# Patient Record
Sex: Male | Born: 1965 | Race: White | Hispanic: No | Marital: Married | Smoking: Never smoker
Health system: Southern US, Community
[De-identification: ages and names within clinical notes are randomized; demographics above are authoritative.]

## PROBLEM LIST (undated history)

## (undated) DIAGNOSIS — I82621 Acute embolism and thrombosis of deep veins of right upper extremity: Secondary | ICD-10-CM

## (undated) DIAGNOSIS — Q2730 Arteriovenous malformation, site unspecified: Secondary | ICD-10-CM

## (undated) DIAGNOSIS — I4891 Unspecified atrial fibrillation: Secondary | ICD-10-CM

## (undated) DIAGNOSIS — I82409 Acute embolism and thrombosis of unspecified deep veins of unspecified lower extremity: Secondary | ICD-10-CM

## (undated) DIAGNOSIS — E785 Hyperlipidemia, unspecified: Secondary | ICD-10-CM

## (undated) DIAGNOSIS — I10 Essential (primary) hypertension: Secondary | ICD-10-CM

## (undated) DIAGNOSIS — I480 Paroxysmal atrial fibrillation: Secondary | ICD-10-CM

## (undated) DIAGNOSIS — M519 Unspecified thoracic, thoracolumbar and lumbosacral intervertebral disc disorder: Secondary | ICD-10-CM

## (undated) DIAGNOSIS — H518 Other specified disorders of binocular movement: Secondary | ICD-10-CM

## (undated) DIAGNOSIS — N4340 Spermatocele of epididymis, unspecified: Secondary | ICD-10-CM

## (undated) DIAGNOSIS — Z7901 Long term (current) use of anticoagulants: Secondary | ICD-10-CM

## (undated) HISTORY — PX: ENDOSCOPIC LUMBAR DISCECTOMY W/ LASER: SUR438

## (undated) HISTORY — PX: VASECTOMY: SHX75

## (undated) HISTORY — PX: OTHER SURGICAL HISTORY: SHX169

## (undated) HISTORY — DX: Acute embolism and thrombosis of deep veins of right upper extremity (CMS-HCC): I82.621

## (undated) MED ORDER — SODIUM CHLORIDE 0.9 % IV SOLN
INTRAVENOUS | Status: AC
Start: 2019-11-13 — End: ?

## (undated) MED ORDER — STERILE WATER FOR INJECTION IJ SOLN
3000.0000 mg | Freq: Once | INTRAMUSCULAR | Status: AC
Start: 2019-11-13 — End: 2019-11-13

## (undated) MED ORDER — SODIUM CHLORIDE FLUSH 0.9 % IV SOLN
70.0000 mL | Freq: Once | INTRAVENOUS | Status: AC | PRN
Start: 2019-06-05 — End: ?

## (undated) MED ORDER — DEXAMETHASONE SODIUM PHOSPHATE 4 MG/ML IJ SOLN (CUSTOM)
10.0000 mg | Freq: Once | INTRAMUSCULAR | Status: AC
Start: 2019-11-13 — End: 2019-11-13

---

## 2019-02-20 ENCOUNTER — Inpatient Hospital Stay: Admit: 2019-02-20 | Discharge: 2019-02-20 | Disposition: A | Discharge status: Home Health | Payer: Self-pay

## 2019-02-24 ENCOUNTER — Inpatient Hospital Stay: Admit: 2019-02-24 | Discharge: 2019-02-24 | Disposition: A | Discharge status: Home Health | Payer: Self-pay

## 2019-04-17 ENCOUNTER — Ambulatory Visit: Payer: BLUE CROSS/BLUE SHIELD | Attending: Vascular Surgery | Admitting: Vascular Surgery

## 2019-04-17 ENCOUNTER — Encounter: Payer: Self-pay | Admitting: Vascular Surgery

## 2019-04-17 ENCOUNTER — Other Ambulatory Visit
Admission: RE | Admit: 2019-04-17 | Discharge: 2019-04-17 | Disposition: A | Discharge status: Home / Self Care | Payer: BLUE CROSS/BLUE SHIELD | Source: Ambulatory Visit | Attending: Family | Admitting: Family

## 2019-04-17 VITALS — BP 127/74 | HR 64 | Temp 97.9°F | Resp 16 | Ht 74.0 in | Wt 266.4 lb

## 2019-04-17 DIAGNOSIS — I4891 Unspecified atrial fibrillation: Secondary | ICD-10-CM | POA: Insufficient documentation

## 2019-04-17 DIAGNOSIS — I82621 Acute embolism and thrombosis of deep veins of right upper extremity: Secondary | ICD-10-CM | POA: Insufficient documentation

## 2019-04-17 DIAGNOSIS — M7989 Other specified soft tissue disorders: Secondary | ICD-10-CM | POA: Insufficient documentation

## 2019-04-17 DIAGNOSIS — Z6834 Body mass index (BMI) 34.0-34.9, adult: Secondary | ICD-10-CM | POA: Insufficient documentation

## 2019-04-17 DIAGNOSIS — I872 Venous insufficiency (chronic) (peripheral): Secondary | ICD-10-CM | POA: Insufficient documentation

## 2019-04-17 LAB — HOMOCYSTEINE, TOTAL, BLOOD: Homocysteine: 10 mcmol/L (ref 5–15)

## 2019-04-17 MED ORDER — ELIQUIS 5 MG PO TABS
5.0000 mg | ORAL_TABLET | Freq: Two times a day (BID) | ORAL | Status: AC
Start: 2019-04-13 — End: ?

## 2019-04-17 MED ORDER — DICLOFENAC SODIUM 1 % EX GEL
CUTANEOUS | Status: AC
Start: 2019-01-19 — End: ?

## 2019-04-17 MED ORDER — METOPROLOL SUCCINATE 50 MG OR TB24
50.0000 mg | ORAL_TABLET | Freq: Every day | ORAL | Status: AC
Start: 2019-04-12 — End: ?

## 2019-04-17 NOTE — Patient Instructions (Signed)
-  Two week follow up with Dr. Andreas Ohm    -Please have your la work completed prior to this appointment.    -Please bring the CD of images from previous ultrasound and ct scan at your next appointment.    Thank You

## 2019-04-17 NOTE — Progress Notes (Signed)
[name], MD  Dept. of Surgery/ Vascular and Endovascular Surgery  [address] [place], [address]  [place] [address]  [email or web address]  Appointments: [phone] Fax: [phone]    Consultation                                    Date of Evaluation: 04/17/2019    Referring Physician: Marquis Buggy    Primary Care Physician: No Pcp, Per Patient    Consulting Attending Physician: [name], MD    Service Provided: Outpatient Vascular/Endovascular Surgery Consultation    Reason for Consultation:   Chief Complaint   Patient presents with   . Thoracic Outlet Syndrome   Right forearm swelling    Chief Complaint and History of Present Illness:    Jamie Bartlett is a 53 year old male with PMH of lumbar stenosis, recent diagnosis of A-fib 1 month ago presents for consult right ulnar DVT, r/o venous TOS. Patient is left handed.    Patient reports waking up form sleep with pain and swelling on right forearm about 9 months ago. He did not seek any medical attention until about 3 months ago. The US showed DVT in the right ulnar vein. He was started on Eliquis. He was referred to a vascular surgeon Dr. [name] who ordered CTA. CTA on 02/20/19 showed a mild compression of the right subclavian vein by the first rib as it enters the thorax. Subsequently, patient was referred to [place] for venous TOS evaluation.     Denies any history of right arm/shoulder injury or trauma. Denies any change in color. The swelling have improved since started on Eliquis. Endorses intermittent tingling of the right hand when in dependent position. Denies history of DVT. No family history of DVT. Reports that he used to have the right forearm swelling back 20 years ago when he lift weights. However, the swelling resolved soon after the arm elevation.     Past Medical History:  Past Medical History:   Diagnosis Date   . Acute deep vein thrombosis (DVT) of ulnar vein of right upper extremity (CMS-HCC)        Past Surgical  History:   No past surgical history on file.    Social History:  Social History     Tobacco Use   . Smoking status: Never Smoker   . Smokeless tobacco: Never Used   Substance Use Topics   . Alcohol use: Not on file        Family History:  No family history of malignancy or hypercoagulable states    Home Medications:  Current Outpatient Medications   Medication Sig   . diclofenac (VOLTAREN) 1 % gel 4 GM APPLY ON THE SKIN TWICE A DAY AS NEEDED DISPENSE 500GM   . ELIQUIS 5 MG    . metoprolol succinate (TOPROL XL) 50 MG XL tablet      No current facility-administered medications for this visit.        Allergies:   No Known Allergies     Review of Systems: A 12 point review of systems was completed, reviewed, and pertinent positives and negatives noted in the Patient Health Survey Form archived in [facility].     Physical Exam:  BP 127/74   Pulse 64   Temp 97.9 F (36.6 C) (Temporal)   Resp 16   Ht 6\' 2"  (1.88 m)   Wt 120.8 kg (266 lb 6.8  oz)   BMI 34.21 kg/m   General:  Well developed, well nourished,  male, no acute distress.  Alert, oriented x4.  HEENT: Normocephalic, atraumatic.  Pupils equal, round, reactive to light and accommodation; Sclerae anicteric. Conjunctiva pink. Oropharynx is moist with no lesions. Tongue midline. Dentition intact  Neck: Supple.  Nontender. Trachea in midline. No lymphadenopathy.   Lungs: Clear to ausculation. Equal breath sound with no rales, rhonchi or wheezes.    Heart: No precordial lifts, heaves or thrills.  Regular rate and rhythm. Normal S1, S2. No gallops, rubs or murmurs.  Extremities: right forearm 2+ edema. Palpable right radial pulse. Hand warm. Upper arm without edema. Left radial pulse.  Skin:  Warm, dry, with no lesions. Normal turgor.  Neurologic: Cranial II-XII intact.  No focal motor, sensory or cerebellar neurologic findings.  Deep tendon reflexis are 2+ and equal.    Functional Status:  independent    Diagnostics:  02/20/2019 CTA RUE ([facility]):  FINDINGS:  The aortic arch is within normal limits. The right subclavian artery exits the thorax without evidenc eof compression. The partially visualized carotid and left subclavian arteries are within normal limits.   There is narrowing of the right subclavian vein at the level of the first rib as it enteres the thorax. This may be resulting in mild obstruction. The right internal jugular vein does not demonstrate compression as it enters the thorax.  The visualized muscles of the right upper extremity are normal in bulk and attenuation.  The visualised osseous structures are within normal limits.   The soft tissue structures in the partially visualized abdomen and pelvis are within normal limits.    IMPRESSION:  There is mild compression of the right subclavian vein by the first rib as it enters the thorax. The right internal jugular vein is within normal limits.  The right common carotid and subclavian arteries do not demonstrate extrinsic compression.      01/20/2019 [place] RUE Venous Duplex:  Occlusive deep vein thrombosis int he right ulnar vein. Subcutaneous interstitial edema in the forearm.    Assessment and Plan:  Muneeb Veras is a 53 year old male with recent diagnosis of A-fib 1 month ago presents for consult right ulnar vein DVT, r/o venous TOS.     1. Patient to bring CD of [place] and CTA.     2. Thrombectomy/thrombolysis likely not an option since 9 months have passed since the original incident. Continue Elquis. Unsure if the ulnar DVT and CTA finding of subclavian vein compression is related.     3. Thrombophilia panel    4. RTC in 2 weeks.       I attest that I had personally interviewed and examined Finlay Godbee during this encounter and have reviewed all of the available diagnostic studies along with the resident physician.  IHilma Favors, MD, agree with the recorded history, physical examination and evaluation above and have supervised formulation of the stated impression, recommendations and care  plan.    Patient with acute onset of left forearm swelling and findings of right ulnar vein DVT. It has been about 9 months since diagnosis and patient has been on Eliquis with improvement overall. The patient had CTA of the chest showing right subclavian vein compression.  I do not think these are related at this time due to his physical exam findings. Will have patient bring in his outside studies. He stated that he had a recent right arm ultrasound to evaluate for DVT.  Will order a hypercoagulable workup. Continue anticoagulation at this time.    All of patient's questions answered to the best of my ability.    [name], MD  04/17/2019 7PM

## 2019-04-18 ENCOUNTER — Encounter: Payer: Self-pay | Admitting: Vascular Surgery

## 2019-04-18 LAB — PROTHROMBIN G20210A MUTATION, BLOOD: Factor II Variant Dx:: NORMAL

## 2019-04-19 LAB — APC RESISTANT FV(LEIDEN): APC Resistance Panel: 3.7 (ref >=2.00)

## 2019-04-19 LAB — PROTEIN S FUNCTIONAL: Protein S Function Result: 138 % (ref 66–143)

## 2019-04-21 ENCOUNTER — Telehealth: Payer: Self-pay | Admitting: Vascular Surgery

## 2019-04-21 LAB — PROTEIN C ACTIVITY ASSAY, BLOOD: Protein C Activity: 116 % (ref 70–130)

## 2019-04-21 LAB — ANTI-THROMBIN III, BLOOD: Antithrombin III Level: 93 % (ref 80–120)

## 2019-04-21 NOTE — Telephone Encounter (Signed)
Patient calling to speak to nurse regarding CAT scan and [place]. Per patient he did not have scan done and does not know where he needs to take it. He also stated the [place] needs to be sent to Dr. Denton Brick ph: [phone]. Patient would like a call back to know what the status of orders. Please contact. Thank you

## 2019-04-21 NOTE — Telephone Encounter (Signed)
Spoke with patient who stated that he tried to get the ultrasound images for [place] however Dr. Ezequiel Kayser office stated that our office needs to request it. He is aware the CT scan images that we need were done at [facility]. Informed him to go to that hospital and ask for the images to be burned onto a CD to bring to upcoming apt - he agreed.     Spoke with [place] at Dr. Ezequiel Kayser office who stated they do not have an US done in September in their records however they do have a "vascular doppler" available done 12/7. Requested for images to be burned onto a CD and mailed to [place] - provided specific address. She stated the person who is able to burn the CD has left already however will follow up with him on Monday and hopefully mail it out by then.    The US of the RUE noted in referral done 01/20/19 may have been done with Dr Antionette Char (cardio) [phone]. Called Dr. Burt Knack office and it is closed - office open 830-4pm M-F. Will try calling back on Monday to obtain images    Harrison Mons. RN

## 2019-04-24 NOTE — Telephone Encounter (Signed)
Called Dr Lewanda Rife office and exchange was unable to get a hold of their office twice. They sent a message via fax asking to please call our office back so we can possibly obtain CD with images from 01/2019 ultrasound done.     Awaiting call back at this time    Harrison Mons. RN

## 2019-04-25 NOTE — Telephone Encounter (Addendum)
Spoke with patient who stated that he cannot go to [facility] to get the CD with images and requests for [place] to have it mailed to [place]. Informed him I can reach out however will have to move apt a week out further to give them enough time to send the CD. Confirmed new apt for the following week. Pt stated that he did not get anything done with Dr Maurine Cane - he said he might have gotten the RUE US done at [facility].     Spoke with Lorriane Shire from Radiology at [facility] [phone] who requested a fax of the request for the CTA RUE done 02/2019 and it can be faxed to [phone] or [phone] so images can be mailed out. Faxed over request with fax confirmation received.     Spoke with Loma Sousa from Medical records at [facility] [phone]. She stated to fax over the request to have CD mailed to [place] for patient's RUE US done in September 2020 to [phone] and for follow up we can call corporate office at [phone]. Faxed over request with fax confirmation received    Will await for CDs to arrive prior to upcoming visit with Dr. Daron Offer T. RN

## 2019-04-26 NOTE — Telephone Encounter (Signed)
Received report for RUE [place] from [place] with MR from [facility] and they stated to call [phone] corporate office to follow up on if the CD has been mailed out. Spoke with Waynette Buttery who stated that she is pushing the request through to processing and they should be mailing it out soon.     Harrison Mons RN

## 2019-05-01 ENCOUNTER — Other Ambulatory Visit: Payer: Self-pay | Admitting: Vascular Surgery

## 2019-05-01 ENCOUNTER — Ambulatory Visit: Payer: BLUE CROSS/BLUE SHIELD | Admitting: Vascular Surgery

## 2019-05-01 NOTE — Telephone Encounter (Signed)
Received CD of images from [facility] DVT study - uploaded to [employer] and sent to PACS.    Awaiting CTA of RUE from [facility] at this time.     Harrison Mons RN

## 2019-05-03 NOTE — Telephone Encounter (Signed)
Spoke with Lorriane Shire from [facility] Radiology and she stated she received the request for the CD to be mailed out to [place] - she confirmed it was mailed out on 12/22 and may take 5-7 business days to reach [place]. She confirmed the address it was mailed to as well and stated if we have not received it to call again and they would mail out another copy.    Harrison Mons RN

## 2019-05-08 ENCOUNTER — Other Ambulatory Visit: Payer: Self-pay | Admitting: Vascular Surgery

## 2019-05-08 ENCOUNTER — Ambulatory Visit: Payer: BLUE CROSS/BLUE SHIELD | Attending: Vascular Surgery | Admitting: Vascular Surgery

## 2019-05-08 ENCOUNTER — Encounter: Payer: Self-pay | Admitting: Vascular Surgery

## 2019-05-08 VITALS — BP 120/75 | HR 67 | Temp 97.9°F | Resp 14 | Ht 74.0 in | Wt 270.5 lb

## 2019-05-08 DIAGNOSIS — M7989 Other specified soft tissue disorders: Secondary | ICD-10-CM

## 2019-05-08 DIAGNOSIS — I82621 Acute embolism and thrombosis of deep veins of right upper extremity: Secondary | ICD-10-CM | POA: Insufficient documentation

## 2019-05-08 DIAGNOSIS — Z6834 Body mass index (BMI) 34.0-34.9, adult: Secondary | ICD-10-CM | POA: Insufficient documentation

## 2019-05-08 NOTE — Patient Instructions (Signed)
Please ensure that your diagnostic study is completed prior to your next visit with the doctor. If it will not be done prior to your next appointment please call [phone] to reschedule the appointment.    If you will be getting your study done here at [place] please call Radiology Scheduling Department to schedule your diagnostic study at [phone]    If your study is done outside of [place], you must bring a CD-rom of the images and the report with you to your next appointment.      Your results will be discussed at that visit with the doctor. You will not receive a call to discuss any results unless there is a critical finding. It is important to keep your scheduled appointments.     To make, change, or cancel an appointment please call [phone]

## 2019-05-08 NOTE — Progress Notes (Signed)
Jamie Keys, MD  Dept. of Surgery/ Vascular and Endovascular Surgery  [address] [place], [address]  [place] [address]  [email or web address]  Appointments: [phone] Fax: [phone]                                      Date of Evaluation: 05/08/2019    Primary Care Physician: No Pcp, Per Patient    Attending Physician: Jamie Keys, MD    Service Provided: Outpatient Vascular/Endovascular Surgery Follow up    Reason for Visit:   Chief Complaint   Patient presents with   . Thoracic Outlet Syndrome   Right forearm swelling    History of Present Illness:    Jamie Bartlett is a 54 year old male with a h/o of lumbar stenosis and A-fib diagnosed 03/2019.    Last visit 04/17/2019, pt reported waking up form sleep with pain and swelling on right forearm around 07/2018. He did not seek any medical attention until around 01/2019. The US showed DVT in the right ulnar vein. He was started on Eliquis. He was referred to a vascular surgeon Dr. [name] who ordered CTA. CTA on 02/20/19 showed a mild compression of the right subclavian vein by the first rib as it enters the thorax. Subsequently, patient was referred to [facility] for venous TOS evaluation.   Pt to bring CD of [place] and CTA. Thrombectomy/thrombolysis likely not an option due to delayed presentation. Continued Eliquis. Unsure if the ulnar DVT and CTA finding of subclavian vein compression are related. Ordered thrombophilia panel, results within normal limits.    Today, pt presents for 2 week followup venous thoracic outlet syndrome. He states his symptoms are improving with eliquis. Denies any history of trauma or repetitive overhand motions. Thrombophilia panel within normal limits.     04/17/2019 Thrombophilia panel  Normal    Past Medical History:  Past Medical History:   Diagnosis Date   . Acute deep vein thrombosis (DVT) of ulnar vein of right upper extremity (CMS-HCC)        Past Surgical History:   No past surgical history on file.    Home  Medications:  Current Outpatient Medications   Medication Sig   . diclofenac (VOLTAREN) 1 % gel 4 GM APPLY ON THE SKIN TWICE A DAY AS NEEDED DISPENSE 500GM   . ELIQUIS 5 MG    . metoprolol succinate (TOPROL XL) 50 MG XL tablet      No current facility-administered medications for this visit.        Allergies:   No Known Allergies     Review of Systems: A 12 point review of systems was completed, reviewed, and pertinent positives and negatives noted in the Patient Health Survey Form archived in [place] [facility].     Physical Exam:  BP 120/75 (BP Location: Left arm, BP Patient Position: Sitting, BP cuff size: Large)   Pulse 67   Temp 97.9 F (36.6 C) (Temporal)   Resp 14   Ht 6\' 2"  (1.88 m)   Wt 122.7 kg (270 lb 8.1 oz)   BMI 34.73 kg/m   General:  No acute distress.  Alert, oriented to person, place, time and situation.  HEENT: Normocephalic and atraumatic.  Pupils equal, round, reactive to light and accommodation.  Extraocular muscles intact.  Moist mucous membranes. Pharynx clear.  NECK: Supple without mass.  No evidence of lymphadenopathy.  No carotid bruit, right and left  Cardiovascular: Regular rate and rhythm. No murmur noted  Pulmonary: Clear to auscultation bilaterally. Symmetric expansion. Unlabored breathing.  Abdomen: Soft, nontender, nondistended.  NEURO: No gross abnormalities noted.  Extremities: RLE with swelling of distal forearm, no swelling of proximal arm. 2+ radial pulse bilaterally, good cap refill bilaterally. Sensation intact, motor strength 5/5 bilaterally.     Functional Status:  independent    Diagnostics:  02/20/2019 CTA RUE ([facility]): - outside report  FINDINGS:  The aortic arch is within normal limits. The right subclavian artery exits the thorax without evidenc eof compression. The partially visualized carotid and left subclavian arteries are within normal limits.   There is narrowing of the right subclavian vein at the level of the first rib as it enteres the  thorax. This may be resulting in mild obstruction. The right internal jugular vein does not demonstrate compression as it enters the thorax.  The visualized muscles of the right upper extremity are normal in bulk and attenuation.  The visualised osseous structures are within normal limits.   The soft tissue structures in the partially visualized abdomen and pelvis are within normal limits.    IMPRESSION:  There is mild compression of the right subclavian vein by the first rib as it enters the thorax. The right internal jugular vein is within normal limits.  The right common carotid and subclavian arteries do not demonstrate extrinsic compression.No results to report.      01/20/2019 [place] RUE Venous Duplex: - outside report  Occlusive deep vein thrombosis int he right ulnar vein. Subcutaneous interstitial edema in the forearm.    Assessment and Plan:  Jamie Bartlett is a 54 year old male with a h/o of lumbar stenosis and A-fib diagnosed 03/2019 with ulnar vein DVT in RUE. CTA reviewed in clinic, no apparent stenosis of subclavian vein. Unlikely DVT in ulnar vein caused by vTSO    Outside images have been loaded to [facility] and will be pushed to PACS.    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  Ultrasound from outside shows limited images and there is some ulnar vein thrombus.    Will continue anticoagulation at this time.      1. Will order MR venogram of the right upper extremity.  I called to discuss this with the patient after he left the office since the study had not been fully reviewed while he was here.  2.  Continue eliquis for 2 more months  3. DVT US RUE in 2 months  4. RTC 2 months or sooner if issues arise       Scribe Attestation  The notes I am recording reflect only actions made by and judgments taken by this provider, Dr. Hilma Favors, for whom I am scribing today.  I have performed no independent clinical  work.    Joy Brantley Stage    ____________________________________________________________________    Provider Attestation for Scribed Note    As the attending provider, I agree with the scribed content.  Any changes or edits are noted in the text above.    Hilma Favors, MD(A)

## 2019-06-05 ENCOUNTER — Ambulatory Visit
Admit: 2019-06-05 | Discharge: 2019-06-05 | Disposition: A | Discharge status: Home / Self Care | Payer: BLUE CROSS/BLUE SHIELD | Attending: Vascular Surgery | Admitting: Vascular Surgery

## 2019-06-05 ENCOUNTER — Inpatient Hospital Stay
Admit: 2019-06-05 | Discharge: 2019-06-05 | Disposition: A | Discharge status: Home Health | Payer: BLUE CROSS/BLUE SHIELD | Attending: Vascular Surgery | Admitting: Vascular Surgery

## 2019-06-05 DIAGNOSIS — I82621 Acute embolism and thrombosis of deep veins of right upper extremity: Secondary | ICD-10-CM

## 2019-06-05 DIAGNOSIS — M7989 Other specified soft tissue disorders: Secondary | ICD-10-CM

## 2019-06-06 ENCOUNTER — Telehealth: Payer: Self-pay | Admitting: Vascular Surgery

## 2019-06-06 NOTE — Telephone Encounter (Signed)
Patient requesting a call back from nurse regarding MRA for his arm. He stated they were not able to scan arm because the size of the machine, he could not fit his arm. Please contact. Thank you

## 2019-06-07 NOTE — Telephone Encounter (Addendum)
Spoke with patient who stated that he went to [facility] imaging here at [facility] but was not able to get arm into MRI machine d/t size of his arm. He was then sent to [facility] and he was able to get it in the machine but was unable to do the study still d/t size of his arm. He states theres an Tour manager in [place] that he was referred to by his friend and was told it is the largest machine available. Address is [address] [place], [place] [address] [phone] Informed him it would most likely have to be re-submitted to his insurance for authorization to be done at that facility - informed him I would contact [facility] and they can re-submit. However it may take a couple of days and if authorized he should receive a call to schedule his apt. He verbalized understanding - will keep apt for March with Dr [name] and is aware that he will need to get the CD with images and paper report to the apt.     Spoke with Toniann Fail at [facility] who stated she will contact the coordinator to resubmit for auth.     Loyola Mast RN

## 2019-06-13 ENCOUNTER — Encounter: Payer: Self-pay | Admitting: Vascular Surgery

## 2019-06-13 NOTE — Telephone Encounter (Signed)
Called regarding status of patient's MRA.  Left voicemail for patient to call us back at [phone].    [name], MD(A)

## 2019-06-28 ENCOUNTER — Other Ambulatory Visit: Payer: Self-pay | Admitting: Student in an Organized Health Care Education/Training Program

## 2019-06-29 ENCOUNTER — Inpatient Hospital Stay: Admit: 2019-06-29 | Discharge: 2019-06-29 | Disposition: A | Discharge status: Home Health | Payer: Self-pay

## 2019-07-03 ENCOUNTER — Ambulatory Visit: Payer: BLUE CROSS/BLUE SHIELD | Admitting: Vascular Surgery

## 2019-07-03 ENCOUNTER — Ambulatory Visit: Payer: BLUE CROSS/BLUE SHIELD

## 2019-07-17 ENCOUNTER — Ambulatory Visit: Payer: BLUE CROSS/BLUE SHIELD | Admitting: Vascular Surgery

## 2019-07-24 ENCOUNTER — Encounter: Payer: Self-pay | Admitting: Vascular Surgery

## 2019-07-24 ENCOUNTER — Ambulatory Visit: Payer: BLUE CROSS/BLUE SHIELD | Attending: Vascular Surgery | Admitting: Vascular Surgery

## 2019-07-24 ENCOUNTER — Ambulatory Visit (HOSPITAL_BASED_OUTPATIENT_CLINIC_OR_DEPARTMENT_OTHER)
Admit: 2019-07-24 | Discharge: 2019-07-24 | Disposition: A | Discharge status: Home Health | Payer: BLUE CROSS/BLUE SHIELD | Attending: Vascular Surgery | Admitting: Vascular Surgery

## 2019-07-24 VITALS — BP 146/83 | HR 70 | Temp 97.5°F | Resp 16 | Ht 74.0 in | Wt 276.8 lb

## 2019-07-24 DIAGNOSIS — Z6835 Body mass index (BMI) 35.0-35.9, adult: Secondary | ICD-10-CM | POA: Insufficient documentation

## 2019-07-24 DIAGNOSIS — Q279 Congenital malformation of peripheral vascular system, unspecified: Secondary | ICD-10-CM | POA: Insufficient documentation

## 2019-07-24 DIAGNOSIS — M7989 Other specified soft tissue disorders: Secondary | ICD-10-CM | POA: Insufficient documentation

## 2019-07-24 DIAGNOSIS — I82621 Acute embolism and thrombosis of deep veins of right upper extremity: Secondary | ICD-10-CM

## 2019-07-24 NOTE — Patient Instructions (Signed)
-  internal on the phone with imaging facility to request CD of images to be mailed out to out office.    -Once images have been received Dr. [name] will follow up with pt regarding plan.    Thank You

## 2019-07-24 NOTE — Progress Notes (Signed)
Hilma Favors, MD  Dept. of Surgery/ Vascular and Endovascular Surgery  [place], [place]  [place] [address]  [email or web address]  Appointments: [phone] Fax: [phone]                                  Date of Evaluation: 07/24/2019    Primary Care Physician: No Pcp, Per Patient    Attending Physician: Hilma Favors, MD    Service Provided: Outpatient Vascular/Endovascular Surgery Follow up    Reason for Visit:   Chief Complaint   Patient presents with   . Deep Vein Thrombosis     RUE ulnar vein   . Follow up Results     Same day [place]       History of Present Illness:    Shailen Thielen is a 54 year old male with a h/o of lumbar stenosis and A-fib diagnosed 03/2019.    During 04/17/2019 visit, pt reported waking up form sleep with pain and swelling on right forearm around 07/2018. He did not seek any medical attention until around 01/2019. The US showed DVT in the right ulnar vein. He was started on Eliquis. He was referred to a vascular surgeon Dr. Antionette Char who ordered CTA. CTA on 02/20/19 showed a mild compression of the right subclavian vein by the first rib as it enters the thorax. Subsequently, patient was referred to [facility] for venous TOS evaluation.   Pt to bring CD of [place] and CTA. Thrombectomy/thrombolysis likely not an option due to delayed presentation. Continue Eliquis. Unsure if the ulnar DVT and CTA finding of subclavian vein compression are related. Ordered thrombophilia panel, results within normal limits.    Last visit 05/08/19, symptoms improved with Eliquis. Denied history of trauma or repetitive overhand motions. Reviewed Thrombophilia panel, within normal limits. 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 Outside [place] images reviewed, limited images and some ulnar vein thrombus. Continue anticoagulation. Ordered MR Venogram of RUE. Ordered DVT US RUE.    MRA was performed @ [facility] @  [place], pending results.  US DVT UE right (3/22) showed proximal ulnar veins with thrombus/ non comp; large dilated vein branches deep from thrombosed ulnar vein; collaterals seen but difficult to define.    Today, pt presents for 1 month further management of DVT of ulnar vein of RUE. He states his symptoms are improving with eliquis. RUE swelling stays the same.    Past Medical History:  Past Medical History:   Diagnosis Date   . Acute deep vein thrombosis (DVT) of ulnar vein of right upper extremity (CMS-HCC)        Past Surgical History:   No past surgical history on file.    Home Medications:  Current Outpatient Medications   Medication Sig   . diclofenac (VOLTAREN) 1 % gel 4 GM APPLY ON THE SKIN TWICE A DAY AS NEEDED DISPENSE 500GM   . ELIQUIS 5 MG    . metoprolol succinate (TOPROL XL) 50 MG XL tablet      No current facility-administered medications for this visit.        Allergies:   No Known Allergies     Review of Systems: A 12 point review of systems was completed, reviewed, and pertinent positives and negatives noted in the Patient Health Survey Form archived in [place] [facility].     Physical Exam:  BP 146/83  Pulse 70   Temp 97.5 F (36.4 C) (Temporal)   Resp 16   Ht 6\' 2"  (1.88 m)   Wt 125.5 kg (276 lb 12.6 oz)   BMI 35.54 kg/m   General:  No acute distress.  Alert, oriented to person, place, time and situation.  HEENT: Normocephalic and atraumatic.  Pupils equal, round, reactive to light and accommodation.  Extraocular muscles intact.  Moist mucous membranes. Pharynx clear.  NECK: Supple without mass.  No evidence of lymphadenopathy.  No carotid bruit, right and left  Cardiovascular: Regular rate and rhythm. No murmur noted  Pulmonary: Clear to auscultation bilaterally. Symmetric expansion. Unlabored breathing.  Abdomen: Soft, nontender, nondistended.  NEURO: No gross abnormalities noted.  Extremities: RLE with swelling of distal forearm, no swelling of proximal arm. 2+ radial pulse  bilaterally, good cap refill bilaterally. Sensation intact, motor strength 5/5 bilaterally.             Functional Status:  independent    Diagnostics:  07/24/19 [place] 07/26/19 DUPLEX VENOUS DVT UE RIGHT  See above in HPI.      07/24/19 MRA Upper Extremity Right Side   Pending results - outside study      02/20/2019 CTA RUE ([facility]): - outside report  FINDINGS:  The aortic arch is within normal limits. The right subclavian artery exits the thorax without evidenc eof compression. The partially visualized carotid and left subclavian arteries are within normal limits.   There is narrowing of the right subclavian vein at the level of the first rib as it enteres the thorax. This may be resulting in mild obstruction. The right internal jugular vein does not demonstrate compression as it enters the thorax.  The visualized muscles of the right upper extremity are normal in bulk and attenuation.  The visualised osseous structures are within normal limits.   The soft tissue structures in the partially visualized abdomen and pelvis are within normal limits.    IMPRESSION:  There is mild compression of the right subclavian vein by the first rib as it enters the thorax. The right internal jugular vein is within normal limits.  The right common carotid and subclavian arteries do not demonstrate extrinsic compression.No results to report.      01/20/2019 01/22/2019 RUE Venous Duplex: - outside report  Occlusive deep vein thrombosis int he right ulnar vein. Subcutaneous interstitial edema in the forearm.    Assessment and Plan:  Toure Edmonds is a 54 year old male with a h/o of lumbar stenosis and A-fib diagnosed 03/2019 with ulnar vein DVT in RUE. CTA and 04/2019 reviewed in clinic, no apparent stenosis of subclavian vein. Unlikely DVT in ulnar vein caused by vTSO.    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   Called [facility] in [place] - will ask for images  and report to be sent to our office. Given that the patient is clinically doing about the same and tolerating anticoagulation, will review imaging after it has been received.    1. Schedule Zoom meeting for MRA result discussion - will re-contact patient once studies have arrived  2. Continue anticoagulation at this time    [place], MD PGY-1  General Surgery   07/24/2019      Scribe Attestation  The notes I am recording reflect only actions made by and judgments taken by this provider, Dr. 07/26/2019, for whom I am scribing today.  I have performed no independent  clinical work.    Joy Mena Pauls    ____________________________________________________________________    Provider Attestation for Scribed Note    As the attending provider, I agree with the scribed content.  Any changes or edits are noted in the text above.    I spent 30 minutes on the day of the encounter reviewing the patient's diagnostic tests including time spent preparing for today's visit (e.g. reviewing medical records and pertinent tests); independently interpreting results; obtaining and/or reviewing the patient's history; performing an appropriate physical examination/evaluation; counseling and educating the patient and/or family/caregiver; ordering appropriate medications, tests and/or procedures; referring and/or communicating with other healthcare professionals; coordinating care; and documenting in the electronic medical record.      [name]

## 2019-07-25 ENCOUNTER — Telehealth: Payer: Self-pay | Admitting: Vascular Surgery

## 2019-07-25 NOTE — Telephone Encounter (Signed)
Request for MRI/MRA of left arm CD with images and report to be mailed to [place] was faxed to [facility] at [phone] with fax confirmation received. Per Medical records [phone], they are short staffed and will mail it out as soon as possible - may be in about a week or two.     Loyola Mast RN

## 2019-07-25 NOTE — Telephone Encounter (Signed)
Opened in error

## 2019-08-17 ENCOUNTER — Telehealth: Payer: Self-pay | Admitting: Vascular Surgery

## 2019-08-17 NOTE — Telephone Encounter (Signed)
LVM with medical records at:     [facility]  [facility]  [address]  [address]  [place], [place] [address]  Phone:  [phone]  Fax:  [phone]    Informed them to please call as I had requested a CD to be mailed to [place] and have no received it yet. MRI/MRA of LUE is needed and to please call back with an update    Loyola Mast. RN

## 2019-08-25 ENCOUNTER — Telehealth: Payer: Self-pay | Admitting: Vascular Surgery

## 2019-08-25 NOTE — Telephone Encounter (Signed)
Spoke with Casimer Bilis from [facility] regarding the CD mail request I faxed last week. She asked if I could refax the request again to [phone]. Faxed request as requested.     Loyola Mast RN

## 2019-09-14 ENCOUNTER — Telehealth: Payer: Self-pay | Admitting: Vascular Surgery

## 2019-09-14 NOTE — Telephone Encounter (Signed)
LVM with [facility] to follow up on request sent out 3/23 and 4/23 for MRA/MRI imaging studies done for patient's left arm. No CDs have been received at this time and was calling for an update. Left office number to call. Awaiting call back.    Loyola Mast RN

## 2019-09-21 ENCOUNTER — Telehealth: Payer: Self-pay | Admitting: Vascular Surgery

## 2019-09-21 NOTE — Telephone Encounter (Signed)
Spoke with Boyd Kerbs at [facility] in [place] who confirms they have MRI studies and US venous duplex studies for right arm on file. Confirmed fax number to send request to for CD images and report to be mailed to [place]. Faxed it to [phone] with fax confirmation received    Loyola Mast. RN

## 2019-10-03 ENCOUNTER — Telehealth: Payer: Self-pay | Admitting: Vascular Surgery

## 2019-10-03 NOTE — Telephone Encounter (Signed)
Called patient regarding his MR venogram (which we just received a hard copy of today).   Patient states his right arm is about the same in size, not painful and has no discoloration.    We will review in our conference and plan for further management after discussion.    Patient appreciated the call.  [name], MD(A)

## 2019-10-03 NOTE — Telephone Encounter (Signed)
Received CD with images for patient as requested and uploaded to [facility]. Images shared with Dr [name] and PACS. Dr [name] is aware and stated she will review images and speak with patient regarding plan.    Loyola Mast RN

## 2019-10-06 ENCOUNTER — Other Ambulatory Visit: Payer: Self-pay | Admitting: Vascular Surgery

## 2019-10-26 ENCOUNTER — Encounter: Payer: Self-pay | Admitting: Student in an Organized Health Care Education/Training Program

## 2019-10-26 NOTE — Progress Notes (Signed)
VASCULAR/ENDOVASCULAR SURGERY AND RADIOLOGY CONFERENCE        DATE: 10/26/19     DIAGNOSIS: RUE AV malformation    HPI: This is a 54 year old male presenting with a h/o of lumbar stenosis and A-fib diagnosed 03/2019 with ulnar vein DVT in RUE.      IMAGING:   No results found.    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 Perform an angiogram?  Do compression and have [facility] make a custom compression for his arm?  Or surveillance.    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   Will discuss with patient regarding the symptoms and possible intervention if the patient is having significant pain.

## 2019-10-27 ENCOUNTER — Telehealth: Payer: Self-pay | Admitting: Vascular Surgery

## 2019-10-27 NOTE — Telephone Encounter (Signed)
Patient called to request a call back from   Dr [name].  Doctor promised him a call back a couple weeks ago about his blood clot and he has not received the call.    Please assist     [phone]) [phone] until 4pm

## 2019-10-29 NOTE — Telephone Encounter (Signed)
I called the patient.  Will need to review imaging.  He states that he is having arm symptoms after starting to work out.  Wants to know about need to continue anticoagulation.  I will contact him back regarding plan of care.    [name], MD(A)

## 2019-10-30 ENCOUNTER — Telehealth: Payer: Self-pay | Admitting: Vascular Surgery

## 2019-10-30 NOTE — Telephone Encounter (Signed)
Patient called to provide cell # and work # to doctor in case she calls today or tomorrow regarding plan of care.

## 2019-10-31 ENCOUNTER — Encounter: Payer: Self-pay | Admitting: Vascular Surgery

## 2019-10-31 DIAGNOSIS — Q2730 Arteriovenous malformation, site unspecified: Secondary | ICD-10-CM

## 2019-11-13 ENCOUNTER — Ambulatory Visit: Payer: BLUE CROSS/BLUE SHIELD | Attending: Vascular Surgery | Admitting: Interventional Radiology

## 2019-11-13 DIAGNOSIS — Q2730 Arteriovenous malformation, site unspecified: Secondary | ICD-10-CM | POA: Insufficient documentation

## 2019-11-13 NOTE — Progress Notes (Signed)
Jamie Bartlett. Jamie Schicker, MD  Vascular and Interventional Radiology  [facility]  [address] [place], [place] [address]  Appointments: [phone] Fax: [phone]    Zoom Consultation                                    Date of Evaluation: November 13, 2019    Referring Physician: [name], MD (A)    Primary Care Physician: No Pcp, Per Patient    Consulting Attending Physician: Jamie Bartlett. 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     Patient has been having pain/swelling at right arm since 07/2018 but did not seek medical attention until 01/2019. Ultrasound dated 01/20/2019 of right upper extremity showed DVT in the right ulnar vein. Subsequently the patient was started on Eliquis. Patient was referred to outside vascular surgeon Dr. [name] who ordered CTA. 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 Subsequently, the patient was referred to Dr. [name] of [facility]. On visit with them on 05/08/19, symptoms were improved with Eliquis. He denied history of trauma or repetitive overhand motions. Thrombophilia panel ordered in December 2020 was reviewed, found to be within normal limits. MR Venogram of RUE and [place] (DVT) of RUE were ordered and images imported into [facility].    Today, the patient states that he has been bothered by his right arm for approximately a year and that he has likely had a clot for more than that period of time. He confirms that his swelling worsens while exercising / working out. Pain is localized more towards the ulnar side, but he also  does note pain in the middle of his forearm. Patient states that he attempted to do 2 arm workouts and had immediate swelling of his right arm. He states that he has more right wrist swelling today than he did on 07/24/2019 during his visit with Dr. [name]. Patient is on Eliquis still.    Patient lives farther away [facility] area), but [place] closer [place] in [place] However, he has indicated that this is significantly life limiting for him and he would like to proceed with the procedure as soon as possible.    Past Medical History:  Past Medical History:   Diagnosis Date    Acute deep vein thrombosis (DVT) of ulnar vein of right upper extremity (CMS-HCC)      Past Surgical History:   No past surgical history on file.    Social History:  Social History     Socioeconomic History    Marital status: Single     Spouse name: Not on file    Number of children: Not on file    Years of education: Not on file    Highest education level: Not on file   Occupational History    Not on file   Tobacco Use    Smoking status: Never Smoker    Smokeless tobacco: Never Used   Substance and  Sexual Activity    Alcohol use: Not on file    Drug use: Not on file    Sexual activity: Not on file   Other Topics Concern    Not on file   Social History Narrative    Not on file     Social Determinants of Health     Financial Resource Strain:     Difficulty of Paying Living Expenses:    Food Insecurity:     Worried About Running Out of Food in the Last Year:     Merchant navy officer of Food in the Last Year:    Transportation Needs:     Freight forwarder (Medical):     Lack of Transportation (Non-Medical):    Physical Activity:     Days of Exercise per Week:     Minutes of Exercise per Session:    Stress:     Feeling of Stress :    Social Connections:     Frequency of Communication with Friends and Family:     Frequency of Social Gatherings with Friends and Family:     Attends Religious Services:      Active Member of Clubs or Organizations:     Attends Engineer, structural:     Marital Status:    Intimate Partner Violence:     Fear of Current or Ex-Partner:     Emotionally Abused:     Physically Abused:     Sexually Abused:      Family History:  No family history on file.    Home Medications:  No outpatient medications have been marked as taking for the 11/13/19 encounter (Appointment) with Elliette Seabolt, Jamie Ha, MD.     Allergies:   No Known Allergies     Review of Systems: A 12 point review of systems was completed, reviewed, and pertinent positives and negatives noted in the Patient Health Survey Form archived in [place] [facility].     Physical Exam:  General: No apparent distress.   Eyes: Sclerae anicteric. Moist conjunctivae. Pupils round regular and equal.  Ears, Nose, Throat: Ears and nose within normal limits. Oropharynx clear.   Neck: Full range of motion.   Respiratory: Normal respiratory effort.  Skin: No evidence of jaundice.   Neuro: Grossly nonfocal.  Extremities: Swelling noted on the right arm today on Zoom video.    Functional Status: independent    Diagnostics:  No results found for: ALB, ALT, AST, BUN, [place], CL, CHOL, CO2, CREAT, GFR, GFRAA, GFRNON, GLU, HDL, HGB, A1C, LDL, MG, PHOS, PLT, PLCTEL, K, PSA, NA, SODIUM, TRIG, WBC    Imaging:  No results found.    Assessment and Plan:  [name] is a 54 year old male with history of lumbar stenosis and atrial fibrillation diagnosed 03/2019 who presents with right forearm arteriovenous malformation.     The patient's outside imaging was reviewed with the patient. MR from 06/29/2019 was reviewed in detail, including location of occluded right ulnar vein and collateral vessels comprising the arteriovenous malformation. It was discussed that this point the right ulnar vein likely could not be stented open and has likely scarred down permanently. Additionally, stock imaging about arteriovenous malformations including pathophysiology were reviewed. We  reviewed risks of percutaneous sclerosis including but not limited to bleeding, infection, pain, damage to other organs, including mitigating measures including acquiring coagulation panels, sterilization technique, pain management, and image guidance during the procedure. Compartment syndrome was also reviewed. The patient will be given steroids to control  swelling. Due to risks from compartment syndrome, the procedure may need to be staged into multiple parts, with interval in between usually lasting 2 weeks. Moderate sedation vs. general anesthesia were discussed with the patient including screening process. It was reviewed that the procedure is minimally invasive and would only involve small incisions. Recovery and return to work schedule were discussed. Patient has indicated that he has difficulty with transportation (patient is alone) and is worried about pain control - he was offered 23 hour observation. Patient is amenable to this plan of care. All questions answered to the patient's satisfaction.     1.  Will schedule for right arm arteriovenous access angiogram with possible embolization, possible sclerothrapy of right forearm AVM in the near future (and will order associated pre-procedure labs and tests), with 23 hour observation.    Due to COVID-19 pandemic and a federally declared state of public health emergency, this telemedicine visit was conducted Audio+Video.     Approximately 60 minutes were spent by the physician and nurse practitioner in reviewing imaging and test results, obtaining and reviewing history, performing a physical exam, counseling the patient and family, entering orders, and entering information into the patient's record.    Patient Verification & Telemedicine:    - I have verified that the patient's identification to be correct & valid: Yes  - The patient, and / or surrogate, has been made aware that he is to be evaluated today using a home telemedicine visit technique (audio,  video & digital data transmission): Yes  - The patient, and / or surrogate, has been made aware that he has the right to refuse this type of evaluation at any time during the assessment period, and has been made aware of any alternatives to this type of evaluation: Yes  - The patient, and / or surrogate, has been made aware that he may need further evaluations in the future: Yes  - Patient confirmed their presence in the state of [place] and has consented to proceeding with telemedicine today. Yes     Scribe Attestation  The notes I am recording reflect only actions made by and judgments taken by this provider, Dr. Jacqulyn Bath, for whom I am scribing today.  I have performed no independent clinical work.    Rhea Bleacher Chow    ____________________________________________________________________    Provider Attestation for Scribed Note    As the attending provider, I agree with the scribed content.  Any changes or edits are noted in the text above.    Jamie Bartlett [place]ia Dohner  Patient Questionnaire Responses on 11/13/2019  Will you be in [place] for the duration of your visit?: Yes  Do you consent to proceed with the Video Visit today?: Yes

## 2019-11-13 NOTE — Patient Instructions (Signed)
-   schedule for Right arm angiogram, possible embolization, possible sclerotherapy  - pre op labs with [facility]        - Blood test 1-2 weeks prior your procedure. If you are getting it done outside of [facility], please remind the lab to fax results to [phone].  - Nothing to eat or drink 8 hours prior your procedure  - Okay to take your routine medication in the morning prior your procedure with a sip of water  - If you take ACE inhibitors or Angiotensin Receptor blockers (ARB) for blood pressure, do not take them the morning of procedure, but bring the medication with you.  - Hold diabetes medication the day of the procedure  - Hold Aspirin/NSAIDs (Ibuprofen/Motrin/Advil/Naproxen/Aleve), Plavix and Fish oils for 5 days  - Hold Heparin/Lovenox 1 dose prior procedure  - Hold Eliquis/Xarelto for 2 days  - Hold Coumadin per instruction by your ordering provider and check INR 1 day prior the procedure  - No driving for 24 hours post procedure  - Make sure you have a driver to pick you up.         For scheduling:  Please call [phone], pick option 1, and option 5, to reach IR scheduling office.    For IR MD questions:  Please call [phone] or [phone] or [phone].  Fax # [phone] or [phone]    Ulanda Edison, NP  [facility]  [address] [place]  Interventional Radiology Department, [address]  Email: [email or web address]    Thank you.

## 2019-11-14 ENCOUNTER — Telehealth: Payer: Self-pay

## 2019-11-14 NOTE — Telephone Encounter (Signed)
IR: left voice message to the pt. To call back IR to know Dr. Mervyn Skeeters. Devini's Number: need to get permission to hold Eliquis 3 days prior to the procedure.

## 2019-11-17 ENCOUNTER — Telehealth: Payer: Self-pay

## 2019-11-17 NOTE — Telephone Encounter (Signed)
IR: called pt.'s cardiologist- Dr. Gaylord Shih  T: [phone], ( fax: [phone]) to get permission to hold Eliquis for 3 days prior to the procedure. Talked to the office and left message for [place]. Receptionist said she would call me and let me know. IR number provided [phone].

## 2019-11-23 ENCOUNTER — Telehealth: Payer: Self-pay

## 2019-11-23 NOTE — Telephone Encounter (Signed)
IR: Dr. Gaylord Shih said ok to hold Eliquis for 3 days prior to the procedure per Christine( Dr. Elaina Pattee offive)

## 2019-11-29 ENCOUNTER — Other Ambulatory Visit: Payer: Self-pay | Admitting: Interventional Radiology

## 2019-11-30 ENCOUNTER — Telehealth: Payer: Self-pay

## 2019-11-30 NOTE — Telephone Encounter (Signed)
Pre-op messages left via voicemail instructing patient to call back ASAP for pre-op instructions. Call back number provided ([phone]).

## 2019-11-30 NOTE — Telephone Encounter (Signed)
IR pre-procedure COVID-19 testing order

## 2019-12-04 ENCOUNTER — Telehealth: Payer: Self-pay

## 2019-12-04 ENCOUNTER — Encounter: Payer: Self-pay | Admitting: Interventional Radiology

## 2019-12-04 NOTE — Telephone Encounter (Signed)
Called [facility] to request labs done on 12/01/19. Per Medical records, request needs to be faxed to [phone].  STAT request faxed to above number.  Confirmation received.

## 2019-12-05 ENCOUNTER — Encounter: Admission: RE | Disposition: A | Discharge status: Home Health | Payer: Self-pay | Attending: Interventional Radiology

## 2019-12-05 ENCOUNTER — Ambulatory Visit
Admission: RE | Admit: 2019-12-05 | Discharge: 2019-12-05 | Disposition: A | Discharge status: Home / Self Care | Payer: BLUE CROSS/BLUE SHIELD | Attending: Interventional Radiology | Admitting: Interventional Radiology

## 2019-12-05 ENCOUNTER — Encounter: Payer: Self-pay | Admitting: Interventional Radiology

## 2019-12-05 ENCOUNTER — Ambulatory Visit: Payer: BLUE CROSS/BLUE SHIELD

## 2019-12-05 DIAGNOSIS — I4891 Unspecified atrial fibrillation: Secondary | ICD-10-CM | POA: Insufficient documentation

## 2019-12-05 DIAGNOSIS — Q2730 Arteriovenous malformation, site unspecified: Secondary | ICD-10-CM

## 2019-12-05 DIAGNOSIS — Q278 Other specified congenital malformations of peripheral vascular system: Secondary | ICD-10-CM | POA: Insufficient documentation

## 2019-12-05 DIAGNOSIS — Z86718 Personal history of other venous thrombosis and embolism: Secondary | ICD-10-CM | POA: Insufficient documentation

## 2019-12-05 DIAGNOSIS — Z79899 Other long term (current) drug therapy: Secondary | ICD-10-CM | POA: Insufficient documentation

## 2019-12-05 DIAGNOSIS — Z7901 Long term (current) use of anticoagulants: Secondary | ICD-10-CM | POA: Insufficient documentation

## 2019-12-05 HISTORY — DX: Arteriovenous malformation, site unspecified: Q27.30

## 2019-12-05 HISTORY — DX: Unspecified atrial fibrillation (CMS-HCC): I48.91

## 2019-12-05 HISTORY — DX: Acute embolism and thrombosis of deep veins of right upper extremity (CMS-HCC): I82.621

## 2019-12-05 LAB — ACT K (ISTAT) POINT OF CARE TESTING: ACT K (iSTAT) POC: 213 SEC. — ABNORMAL HIGH (ref 74–137)

## 2019-12-05 SURGERY — IR EMBO NON-CNS
Anesthesia: Moderate Sedation - by non-anesthesia staff only | Laterality: Right | Wound class: Class I (Clean)

## 2019-12-05 MED ORDER — SODIUM CHLORIDE 0.9 % IV SOLN
INTRAVENOUS | Status: DC
Start: 2019-12-05 — End: 2019-12-05

## 2019-12-05 MED ORDER — LIDOCAINE HCL 1 % IJ SOLN
INTRAMUSCULAR | Status: DC | PRN
Start: 2019-12-05 — End: 2019-12-05
  Administered 2019-12-05 (×2): 10 mL via INTRADERMAL

## 2019-12-05 MED ORDER — IOHEXOL 300 MG/ML IJ SOLN
INTRAMUSCULAR | Status: AC
Start: 2019-12-05 — End: ?
  Filled 2019-12-05: qty 50

## 2019-12-05 MED ORDER — DEXAMETHASONE SODIUM PHOSPHATE 20 MG/5ML IJ SOLN
INTRAMUSCULAR | Status: AC
Start: 2019-12-05 — End: ?
  Filled 2019-12-05: qty 5

## 2019-12-05 MED ORDER — FENTANYL CITRATE (PF) 100 MCG/2ML IJ SOLN
INTRAMUSCULAR | Status: AC
Start: 2019-12-05 — End: ?
  Filled 2019-12-05: qty 2

## 2019-12-05 MED ORDER — PROTAMINE SULFATE 10 MG/ML IV SOLN
INTRAVENOUS | Status: DC | PRN
Start: 2019-12-05 — End: 2019-12-05
  Administered 2019-12-05 (×2): 40 mg via INTRAVENOUS

## 2019-12-05 MED ORDER — DEXAMETHASONE SODIUM PHOSPHATE 4 MG/ML IJ SOLN (CUSTOM)
10.0000 mg | Freq: Once | INTRAMUSCULAR | Status: AC
Start: 2019-12-05 — End: 2019-12-05
  Administered 2019-12-05: 10 mg via INTRAVENOUS
  Filled 2019-12-05: qty 2.5

## 2019-12-05 MED ORDER — METHYLPREDNISOLONE 4 MG OR KIT
ORAL_TABLET | ORAL | 0 refills | Status: DC
Start: 2019-12-05 — End: 2019-12-06

## 2019-12-05 MED ORDER — PROTAMINE SULFATE 10 MG/ML IV SOLN
INTRAVENOUS | Status: AC
Start: 2019-12-05 — End: ?
  Filled 2019-12-05: qty 5

## 2019-12-05 MED ORDER — MIDAZOLAM HCL 2 MG/2ML IJ SOLN
INTRAMUSCULAR | Status: DC | PRN
Start: 2019-12-05 — End: 2019-12-05
  Administered 2019-12-05 (×2): 0.5 mg via INTRAVENOUS
  Administered 2019-12-05 (×3): 1 mg via INTRAVENOUS

## 2019-12-05 MED ORDER — HEPARIN (PORCINE) IN NACL 1000-0.9 UT/500ML-% IV SOLN
INTRAVENOUS | Status: AC
Start: 2019-12-05 — End: ?
  Filled 2019-12-05: qty 500

## 2019-12-05 MED ORDER — HEPARIN SODIUM (PORCINE) 1000 UNIT/ML IJ SOLN (CUSTOM)
INTRAMUSCULAR | Status: DC | PRN
Start: 2019-12-05 — End: 2019-12-05
  Administered 2019-12-05 (×2): 5000 [IU] via INTRAVENOUS

## 2019-12-05 MED ORDER — MIDAZOLAM HCL (PF) 2 MG/2ML IJ SOLN
INTRAMUSCULAR | Status: AC
Start: 2019-12-05 — End: ?
  Filled 2019-12-05: qty 2

## 2019-12-05 MED ORDER — LIDOCAINE HCL 1 % IJ SOLN
INTRAMUSCULAR | Status: AC
Start: 2019-12-05 — End: ?
  Filled 2019-12-05: qty 20

## 2019-12-05 MED ORDER — NITROGLYCERIN 50 MG/250 ML D5W PREMIX INFUSION (~~LOC~~)
INTRAVENOUS | Status: DC | PRN
Start: 2019-12-05 — End: 2019-12-05
  Administered 2019-12-05 (×2): 100 ug via INTRA_ARTERIAL

## 2019-12-05 MED ORDER — HEPARIN (PORCINE) IN NACL 1000-0.9 UT/500ML-% IV SOLN
INTRAVENOUS | Status: AC
Start: 2019-12-05 — End: ?
  Filled 2019-12-05: qty 1500

## 2019-12-05 MED ORDER — IOHEXOL 300 MG/ML IJ SOLN
INTRAMUSCULAR | Status: AC
Start: 2019-12-05 — End: ?
  Filled 2019-12-05: qty 150

## 2019-12-05 MED ORDER — IOHEXOL 300 MG/ML IJ SOLN
INTRAMUSCULAR | Status: DC | PRN
Start: 2019-12-05 — End: 2019-12-05
  Administered 2019-12-05 (×2): 80 mL

## 2019-12-05 MED ORDER — STERILE WATER FOR INJECTION IJ SOLN
3000.0000 mg | Freq: Once | INTRAMUSCULAR | Status: AC
Start: 2019-12-05 — End: 2019-12-05
  Administered 2019-12-05: 3000 mg via INTRAVENOUS
  Filled 2019-12-05: qty 3000

## 2019-12-05 MED ORDER — CEFAZOLIN SODIUM 1 GM IJ SOLR
INTRAMUSCULAR | Status: AC
Start: 2019-12-05 — End: ?
  Filled 2019-12-05: qty 3000

## 2019-12-05 MED ORDER — FENTANYL CITRATE (PF) 100 MCG/2ML IJ SOLN
INTRAMUSCULAR | Status: DC | PRN
Start: 2019-12-05 — End: 2019-12-05
  Administered 2019-12-05: 25 ug via INTRAVENOUS
  Administered 2019-12-05 (×2): 50 ug via INTRAVENOUS
  Administered 2019-12-05: 25 ug via INTRAVENOUS
  Administered 2019-12-05: 50 ug via INTRAVENOUS

## 2019-12-05 MED ORDER — HEPARIN SODIUM (PORCINE) 1000 UNIT/ML IJ SOLN (CUSTOM)
INTRAMUSCULAR | Status: AC
Start: 2019-12-05 — End: ?
  Filled 2019-12-05: qty 10

## 2019-12-05 MED ORDER — SODIUM TETRADECYL SULFATE 3 % IV SOLN
INTRAVENOUS | Status: AC
Start: 2019-12-05 — End: ?
  Filled 2019-12-05: qty 6

## 2019-12-05 SURGICAL SUPPLY — 23 items
Bowl Mixing 16oz Graduated Plastic Sterile (Misc Surgical Supply) ×2 IMPLANT
CATH ANGIOGRAPH MP ANG 5.0 FR X .038" X 40 CM HNBR5.0-38-40-P-NS-KMP (Procedural wires/sheaths/catheters/balloons/dilators) ×2 IMPLANT
COVER - ULTRASOUND PROBE (Drapes/towels) ×2 IMPLANT
DEVICE - 3 WAY STOPCOCK (PLASTIC) (Misc Medical Supply) ×4 IMPLANT
DRAPE - FLAT WITH ADHESIVE STRIP (Drapes/towels) ×6 IMPLANT
DRESSING - 2 X 2 PAD (Dressings/packing) ×4 IMPLANT
DRESSING - 4 X 4 XRAY DETECTABLE SPONGES (Dressings/packing) ×2 IMPLANT
DRESSING - SMALL TEGADERM 2 X 2 (Dressings/packing) ×4 IMPLANT
GLOVES BIOGEL PI 6.5 LF PF NON-LATEX (Gloves/Gowns) ×2 IMPLANT
GLOVES SUPERSEN 7.5 LTX PF (Gloves/Gowns) ×2 IMPLANT
GUIDEWIRE VASCULAR 0.018IN 180CM 90/45DEG DOUBLE ANGLE TIP GLIDEWIRE GT (Procedural wires/sheaths/catheters/balloons/dilators) ×2 IMPLANT
GUIDEWIRE VASCULAR STAINLESS STEEL PTFE L145 CM L20 CM OD.035 IN CEREBRAL BENTSON PLUS STRAIGHT FLEX (Procedural wires/sheaths/catheters/balloons/dilators) ×2 IMPLANT
Guidewire Vascular 0.016in 180cm Straight 25m Tip Fathom 16 Steerable (Procedural wires/sheaths/catheters/balloons/dilators) ×2 IMPLANT
KIT INTRO 10CM 5FR 22GA GLIDESHEATH SLNDR .021IN 25MM PLS HDRPH JKT NDL DIL SHORTANGLE MINIWIRE 45CM (Kits/Sets/Trays) ×2 IMPLANT
MICROCATHETER 2.4FR 130CM 750PSI HEMOSTATIC VALVE RO MARKER HYDROPHILIC PROGREAT (Procedural wires/sheaths/catheters/balloons/dilators) ×2 IMPLANT
PACK - BODY IR (Procedure Packs/kits) ×2 IMPLANT
SYRINGE - MEDALLION 1CC DARK GREEN (Misc Medical Supply) ×2 IMPLANT
SYRINGE - MEDALLION 3CC DARK GREEN (Misc Medical Supply) ×10 IMPLANT
SYRINGE - MEDALLION 3CC LIGHT BLUE (Misc Medical Supply) ×10 IMPLANT
SYRINGE - MEDALLION 3CC RED (Misc Medical Supply) ×4 IMPLANT
SYRINGE MEDALLION 10ML FIXED MALE LUER CONNECTOR WHITE STERILE (Misc Medical Supply) ×4 IMPLANT
TOWELS, STERILE BLUE (Drapes/towels) ×2 IMPLANT
WIRE - 035 X 150CM TERUMO ANGLED GLIDEWIRE STANDARD (Procedural wires/sheaths/catheters/balloons/dilators) ×2 IMPLANT

## 2019-12-05 NOTE — OR Nursing (Signed)
Nursing Sedation Completion    [facility] NURSING SEDATION COMPLETION:   Sedation Level Achieved:  Moderate  Complications:  None  Patient tolerance:  Tolerated well, no immediate complications

## 2019-12-05 NOTE — Brief Op Note (Signed)
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    PROCEDURE: Procedure(s):  Image guided Right arm angiogram, possible embolization, possible sclerotherapy with moderate sedation, possible 23 hour observation, Treatment #1 - Wound Class: Class I (Clean) - Incision Closure: No Incision / NA    Physician(s): Surgeon(s) and Role:     * Marcelus Dubberly, [name], MD - Primary     * Cramer, Geryl Councilman, MD - Resident - Assisting     * Willeen Cass, Amanda Cockayne, MD - Fellow    FINDINGS:  - Patent and superficial brachial artery, successfully accessed antegrade. Diagnostic angiogram demonstrates a small arterial feeder into the malformation off the midulnar artery. The vessel was too small to catheterize.  - Hemostasis @ arteriotomy achieved with manual compression after reversing with protamine.  - Direct puncture of the slow flow portion of the malformation was performed x2. Successful sclerosis with 59mL Sotradecol foam (3:1 air/STS via Tessari technique).     SPECIMENS: None    Fluids/Blood Products:   - EBL: 22mL    DISPOSITION:  - Stable. Likely discharge home in a sling after 2 hour recovery in PACU and reassessment of the RUE neuro exam.   - Monitor vitals  - Pain management  - Medrol dose pack upon discharge

## 2019-12-05 NOTE — H&P (Signed)
Jamie Bartlett is a 54 year old male,   Scheduled for Procedure(s):  Image guided Right arm angiogram, possible embolization, possible sclerotherapy with moderate sedation, possible 23 hour observation, Treatment #1 on   12/05/2019 - POD Day of Surgery    See IR clinic note for details.    PMHx:   Past Medical History:   Diagnosis Date   . A-fib (CMS-HCC)    . Acute deep vein thrombosis (DVT) of ulnar vein of right upper extremity (CMS-HCC)    . AVM (arteriovenous malformation)    . Deep vein thrombosis (DVT) of ulnar vein of right upper extremity, unspecified chronicity (CMS-HCC)      Surgical HX: No past surgical history on file.  Home meds:   Medications Prior to Admission   Medication Sig Dispense Refill Last Dose   . diclofenac (VOLTAREN) 1 % gel 4 GM APPLY ON THE SKIN TWICE A DAY AS NEEDED DISPENSE 500GM      . ELIQUIS 5 MG    12/02/2019   . metoprolol succinate (TOPROL XL) 50 MG XL tablet         Current Inpatient Medications:  . ceFAZolin (ANCEF) IV  3,000 mg Once   . dexAMETHasone  10 mg Once     . sodium chloride           ALLERGIES:  No Known Allergies    History and Physical    Pre-op Diagnosis: right forearm AVM  Review of systems  Constitutional:  Denies Fever  HEENT:  Denies tinnitus  Cardiovascular:  Denies chest pain  Respiratory:  Denies difficulty breathing  Gastrointestinal:  Denies indigestion  Neurological:  Denies headache  Allergic/Immunologic:  Denies hives  Dermatological:  Denies Skin Rash    Physical Exam:  General Appearance:  Well appearing and No acute distress  HEENT:  EOMI  Respiratory:  Good chest wall excursion and Breathing unlabored  Neurologic:  Awake, Alert and Oriented  GU:  Voids spontaneously  GI:  Abdomen non-distended  Extremities:  Other (see comments)  ASA Classification: class 2 - patient with mild systemic disease        Mallampati score:  II - soft palate, uvula, fauces visible    Sedation to be used?: Yes    Mouth Opening:  3 cm or greater  Dentition:  Normal  Jaw  protrusion:  Normal  Thyromental distance:  6 cm  Neck Anatomy:  Normal  C-spine mobility:  Full    Beard Present: No    History of difficult intubation:  No  Sedation Level Intended:  Moderate    Coags Reviewed: Yes      H&P Additional Comments:  Right forearm AVM       12/05/19  0834   BP: 132/79   Pulse: 65   Resp: 9   Temp: 97.5 F (36.4 C)   SpO2: 99%       LABS:  No visits with results within 3 Day(s) from this visit.   Latest known visit with results is:   Hospital Outpatient Visit on 04/17/2019   Component Date Value Ref Range Status   . Protein S Function Result 04/17/2019 138  66 - 143 % Final    Comment: (NOTE)  INTERPRETIVE INFORMATION: Protein S, Functional  Patients on warfarin may have decreased functional protein   S values. Patients should be off warfarin therapy for two   weeks for accurate measurement of functional protein S.   Artificially  increased functional protein S values may be  due to heparin therapy or the presence of direct thrombin   inhibitors or factor Xa inhibitors.    Access complete set of age- and/or gender-specific   reference intervals for this test in the [facility]   Test Directory ([email or web address]).  Performed by [facility],  [address], [place] [address] [phone]  [email or web address], Tracy George, MD, Lab. Director     . Homocysteine 04/17/2019 10  5 - 15 mcmol/L Final   . Protein C Activity 04/17/2019 116  70 - 130 % Final    Comment: Falsely low Protein C levels can be seen with coumadin therapy, activated   Protein C resistance, Factor VIII:C levels >200%, and lupus anticoagulants.     . 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         Note that this test is specifically  designed to test for the presence of a   single nucleotide polymorphism in this particular gene and does not address   other potential DNA sequence alterations in this gene.         Marvia Pickles, M.D., Ph.D.     . Factor II Variant Interp: 04/17/2019 It is not possible to provide a comprehensive interpretation for single thrombophilia test results, because abnormalities of other hemostasis parameters may significantly influence thrombophilia risks.   Final    Comment:        The Prothrombin 20210A test was developed and its performance characteristics   determined by [facility], [facility]. It has not   been cleared or approved by the U.S. Food and Drug Administration.  The FDA has determined that such clearance or approval is not necessary.  This test is used for clinical purposes.  It should not be regarded as investigational or for research.  This laboratory is certified under the Clinical Laboratory Improvement   Amendments of 1988 (CLIA 88) as qualified to perform high complexity clinical laboratory testing.     . Act Protein C Resist 04/17/2019 3.70  >=2.00 Final    Comment: (NOTE)  TEST INTERPRETATION:  APC Resistance Profile  Ratios less than 2.00 suggest APC resistance. This method   uses factor V deficient plasma; therefore, APC resistance   due to a nonfactor V mutation will not be detected. Extreme   factor V deficiency may cause abnormal ratio.  Performed by [facility],  [address], [place] [address] [phone]  [email or web address], Tracy George, MD, Lab. Director     . Antithrombin III Level 04/17/2019 93  80 - 120 % Final      Lora Paula, MD  IR Fellow, PGY-7

## 2019-12-05 NOTE — RN OR/Procedure Note (Signed)
1400 - Pt is having some numbness on right hand fingers 3, 4, 5 and forearm. Pain has not significantly change. Dr. Allena Katz is aware. Dr. Harvie Heck. Long will evaluate pt soon for possible dc home if stable.

## 2019-12-05 NOTE — Discharge Instructions (Signed)
[facility]  Interventional Radiology  [address], [place], [place] [address]  [phone]    Vascular Malformation    A vascular malformation is a defect in the development of a blood or lymph vessel. Vascular malformations are present at birth, but they may not cause signs or symptoms until years later. Vascular malformations can occur anywhere that blood or lymph circulates in your body. Lymph is fluid that is part of the body's disease-fighting (immune) system.  There are four types of vascular malformations.    Venous malformations  These affect the veins, which are blood vessels that carry blood back to the heart. These are the most common type.    Arteriovenous malformations  These involve veins and arteries. Arteries are blood vessels that carry oxygen-rich blood away from the heart.    Contact your primary care doctor or Interventional Radiology Department [phone]) if:   You have a temperature of 101 degrees and chills   You have pain in your back or side that will not go away or is getting worse   You have urine that smells bad or looks cloudy, or urine is leaking around the catheter   You notice unusual changes in the skin around the area of the catheter such as increased swelling, redness, bleeding, or soreness  Go to your nearest Emergency Room or call 9-1-1 if:   You have a temperature over 102 degrees   You have uncontrollable pain, shortness of breath, shaking or chills  Medication   If you received sedation: The medications you received for the procedure are still in your system. Do not consume alcohol, operate heavy machinery, drive, or make important decisions for the rest of the day   You may resume taking your regular medications, unless otherwise told by your doctor   If you have mild pain, you may take Acetaminophen (Tylenol ) as approved by your primary doctor   DO NOT take Ibuprofen (Motrin) or Naproxen (Aleve) for the next 24 hours unless advised or approved by your  primary doctor   If you have moderate pain, you may take the prescribed pain medication as instructed  Activity   You may return to your normal activity the day after the procedure   No exercising or lifting heavy objects (nothing greater than 10 pounds) for the next 72 hours   Avoid any activity that causes a pulling sensation, pain around the catheter, or kinking in the catheter   You may take a shower in 2 days   DO NOT soak in a bathtub or hot tub   DO NOT swim when you have this catheter  Follow these instructions at home:   Take over-the-counter and prescription medicines only as told by your health care provider.   Return to your normal activities as told by your health care provider. Ask your health care provider what activities are safe for you.   Keep all follow-up visits as told by your health care provider. This is important.  Contact a health care provider if:   You have a fever.   You have pain.   You have a vascular malformation near your skin that changes size, bleeds, or becomes painful.   You have difficulty swallowing.   You have a headache, backache, or loss of feeling (numbness).   You have a cough.   You have dizziness, weakness, or confusion.  Get help right away if:     You have trouble breathing.   You have  severe bleeding.   You cough up blood.   You have a severe headache or back pain.   You have loss of movement.   You have any symptoms of a stroke. "BE FAST" is an easy way to remember the main warning signs of a stroke:  ? B - Balance. Signs are dizziness, sudden trouble walking, or loss of balance.  ? E - Eyes. Signs are trouble seeing or a sudden change in vision.  ? F - Face. Signs are sudden weakness or numbness of the face, or the face or eyelid drooping on one side.  ? A - Arms. Signs are weakness or numbness in an arm. This happens suddenly and usually on one side of the body.  ? S - Speech. Signs are sudden trouble speaking, slurred speech, or trouble  understanding what people say.  ? T - Time. Time to call emergency services. Write down what time symptoms started.   You have other signs of a stroke, such as:  ? A sudden, severe headache with no known cause.  ? Nausea or vomiting.  ? Seizure.  Summary   A vascular malformation is an abnormal development of a blood or lymph vessel that you are born with.   Symptoms depend on the type, size, and location of the malformation that you have.   You might not develop symptoms for many years.   Imaging studies are needed to diagnose vascular malformations inside the body.   There are many options for treatment. The best treatment will depend on the type, size, symptoms, and location of the malformation.  This information is not intended to replace advice given to you by your health care provider. Make sure you discuss any questions you have with your health care provider.  Document Released: 01/14/2017 Document Revised: 01/14/2017 Document Reviewed: 01/14/2017  Elsevier Interactive Patient Education  2019 [employer]    Sedation    You received sedatives during your procedure.    The medications were:(drug/dose/time) 1. Fentanyl  2. Versed  You may feel sleepy/not yourself for several hours today. We suggest you remain with family or friends for rest of day.   Do not drive, make important decisions, or drink alcohol for 24 hours.    Moderate Conscious Sedation, Adult, Care After  These instructions provide you with information about caring for yourself after your procedure. Your health care provider may also give you more specific instructions. Your treatment has been planned according to current medical practices, but problems sometimes occur. Call your health care provider if you have any problems or questions after your procedure.  What can I expect after the procedure?  After your procedure, it is common:   To feel sleepy for several hours.   To feel clumsy and have poor balance for several hours.   To  have poor judgment for several hours.   To vomit if you eat too soon.  Follow these instructions at home:  For at least 24 hours after the procedure:     Do not:  ? Participate in activities where you could fall or become injured.  ? Drive.  ? Use heavy machinery.  ? Drink alcohol.  ? Take sleeping pills or medicines that cause drowsiness.  ? Make important decisions or sign legal documents.  ? Take care of children on your own.   Rest.  Eating and drinking   Follow the diet recommended by your health care provider.   If you vomit:  ? Drink water,  juice, or soup when you can drink without vomiting.  ? Make sure you have little or no nausea before eating solid foods.  General instructions   Have a responsible adult stay with you until you are awake and alert.   Take over-the-counter and prescription medicines only as told by your health care provider.   If you smoke, do not smoke without supervision.   Keep all follow-up visits as told by your health care provider. This is important.  Contact a health care provider if:   You keep feeling nauseous or you keep vomiting.   You feel light-headed.   You develop a rash.   You have a fever.  Get help right away if:   You have trouble breathing.    This information is not intended to replace advice given to you by your health care provider. Make sure you discuss any questions you have with your health care provider.  Document Released: 02/08/2013 Document Revised: 09/23/2015 Document Reviewed: 08/10/2015  Elsevier Interactive Patient Education  2019 [employer].

## 2019-12-05 NOTE — RN OR/Procedure Note (Signed)
Debrief is to be conducted before the Attending Surgeon leaves the room:    Attending Surgeon, Anesthesiologist, IR nurse(s), IR tech(s) verbally confirm with the team:     1) Team verbally confirms procedure performed - RUE angiogram and RUE venogram with sotradecol treatment 8ml  2) Any equipment/implant problems addressed: none*  3) Team reviews any key concerns for recovery and management of patient: monitor x 2 hours then IR team to reassess before dc home. Pt to be dc with sling*   4) Review of any specimens specific to the procedure: none*   5) Intra-operative complication: none*

## 2019-12-06 ENCOUNTER — Encounter: Payer: Self-pay | Admitting: Nurse Practitioner

## 2019-12-06 ENCOUNTER — Telehealth: Payer: Self-pay

## 2019-12-06 DIAGNOSIS — Q2730 Arteriovenous malformation, site unspecified: Secondary | ICD-10-CM

## 2019-12-06 MED ORDER — METHYLPREDNISOLONE 4 MG OR KIT
ORAL_TABLET | ORAL | 0 refills | Status: DC
Start: 2019-12-06 — End: 2019-12-26

## 2019-12-06 NOTE — Telephone Encounter (Signed)
Received call from pt requesting RX given for Medrol Dose pack to be sent to incorrect pharmacy. Pt provided pharmacy info, updated in Epic.  E-mail sent to NP.  RX sent to correct pharmacy by NP.  Called pt and notified.  Pt was thankful.

## 2019-12-20 ENCOUNTER — Telehealth: Payer: Self-pay | Admitting: Interventional Radiology

## 2019-12-20 NOTE — Telephone Encounter (Signed)
Patient requesting to speak with Dr Jacqulyn Bath. Patient advice the swelling on his right arm is the same as prior to surgery. Please assist thank you

## 2019-12-22 ENCOUNTER — Telehealth: Payer: Self-pay

## 2019-12-22 NOTE — Telephone Encounter (Signed)
I received an email that patient wanted to speak with me in regards to appointment scheduled 12/26/2019. I also received an email from my nursing staff stating patient needs a covid test before hand.     I received an email from Dr. Jacqulyn Bath on 12/05/2019. They were able to treat more then they originally expected and the procedure on 12/26/2019 should have been cancelled.     I am cancelling the case in Epic. I also left a message for patient in regards to COVID testing before verifying the email from Dr. Jacqulyn Bath. When patient calls back I will inform him the procedure is no longer needed.

## 2019-12-25 ENCOUNTER — Telehealth: Payer: Self-pay | Admitting: Interventional Radiology

## 2019-12-25 NOTE — Telephone Encounter (Signed)
I left a message for Jamie Bartlett on his mobile phone and his work phone. Dr. Jacqulyn Bath would like to re-add on the patient for IR procedure tomorrow.     Informed patient via voicemail that check in time is 0615, there is nothing to eat or drink 8 hours prior to procedure.     Patient will need a covid test done today. Patient lives in [place] and was originally planning to have test done locally by him. I need him to call me back and confirm that he is getting the test done today out there or it could delay his procedure here.

## 2019-12-25 NOTE — Telephone Encounter (Signed)
**   UPDATED CHECK-IN TIME **  Scheduled patient for IR procedure.     Scheduled: 12/26/2019 @ 0900am   Check-in @ 0745am   Ancillary order placed and scheduled    Patient Instructions:   1) Nothing to eat or drink 8 hours prior to the procedure.   2) CV-19 test - to be done locally. LVM to confirm  3) No labs needed prior to procedure.

## 2019-12-26 ENCOUNTER — Ambulatory Visit: Payer: BLUE CROSS/BLUE SHIELD

## 2019-12-26 ENCOUNTER — Encounter: Admission: RE | Disposition: A | Discharge status: Home Health | Payer: Self-pay | Attending: Interventional Radiology

## 2019-12-26 ENCOUNTER — Ambulatory Visit: Admission: RE | Admit: 2019-12-26 | Payer: BLUE CROSS/BLUE SHIELD | Admitting: Interventional Radiology

## 2019-12-26 ENCOUNTER — Encounter: Payer: Self-pay | Admitting: Interventional Radiology

## 2019-12-26 ENCOUNTER — Encounter: Admission: RE | Payer: Self-pay

## 2019-12-26 ENCOUNTER — Observation Stay
Admission: RE | Admit: 2019-12-26 | Discharge: 2019-12-27 | Disposition: A | Discharge status: Home / Self Care | Payer: BLUE CROSS/BLUE SHIELD | Attending: Interventional Radiology | Admitting: Interventional Radiology

## 2019-12-26 DIAGNOSIS — I82621 Acute embolism and thrombosis of deep veins of right upper extremity: Secondary | ICD-10-CM | POA: Insufficient documentation

## 2019-12-26 DIAGNOSIS — Z7901 Long term (current) use of anticoagulants: Secondary | ICD-10-CM | POA: Insufficient documentation

## 2019-12-26 DIAGNOSIS — I4891 Unspecified atrial fibrillation: Secondary | ICD-10-CM | POA: Insufficient documentation

## 2019-12-26 DIAGNOSIS — Z20822 Contact with and (suspected) exposure to covid-19: Secondary | ICD-10-CM | POA: Insufficient documentation

## 2019-12-26 DIAGNOSIS — Q279 Congenital malformation of peripheral vascular system, unspecified: Principal | ICD-10-CM | POA: Insufficient documentation

## 2019-12-26 LAB — CORONAVIRUS DISEASE 2019 (COVID-19) SCOVS
COVID-19 Comment: NOT DETECTED
COVID-19 Result: NOT DETECTED

## 2019-12-26 SURGERY — IR EMBO NON-CNS
Anesthesia: Moderate Sedation - by non-anesthesia staff only | Laterality: Right

## 2019-12-26 SURGERY — IR EMBO NON-CNS
Anesthesia: Moderate Sedation - by non-anesthesia staff only | Laterality: Right | Wound class: Class I (Clean)

## 2019-12-26 MED ORDER — OXYCODONE HCL 5 MG OR TABS
5.0000 mg | ORAL_TABLET | Freq: Four times a day (QID) | ORAL | Status: DC | PRN
Start: 2019-12-26 — End: 2019-12-27

## 2019-12-26 MED ORDER — ONDANSETRON HCL 4 MG OR TABS
4.0000 mg | ORAL_TABLET | Freq: Three times a day (TID) | ORAL | Status: DC | PRN
Start: 2019-12-26 — End: 2019-12-27
  Filled 2019-12-26 (×2): qty 1

## 2019-12-26 MED ORDER — MIDAZOLAM HCL 2 MG/2ML IJ SOLN
INTRAMUSCULAR | Status: DC | PRN
Start: 2019-12-26 — End: 2019-12-26
  Administered 2019-12-26 (×3): 1 mg via INTRAVENOUS

## 2019-12-26 MED ORDER — DIPHENHYDRAMINE HCL 25 MG OR TABS OR CAPS
25.0000 mg | ORAL_CAPSULE | Freq: Four times a day (QID) | ORAL | Status: DC | PRN
Start: 2019-12-26 — End: 2019-12-27

## 2019-12-26 MED ORDER — LIDOCAINE HCL 1 % IJ SOLN
INTRAMUSCULAR | Status: DC | PRN
Start: 2019-12-26 — End: 2019-12-26
  Administered 2019-12-26 (×2): 3 mL

## 2019-12-26 MED ORDER — LIDOCAINE HCL 1 % IJ SOLN
INTRAMUSCULAR | Status: AC
Start: 2019-12-26 — End: ?
  Filled 2019-12-26: qty 20

## 2019-12-26 MED ORDER — IOHEXOL 300 MG/ML IJ SOLN
INTRAMUSCULAR | Status: DC | PRN
Start: 2019-12-26 — End: 2019-12-26
  Administered 2019-12-26: 10 mL via INTRAMUSCULAR

## 2019-12-26 MED ORDER — IOHEXOL 300 MG/ML IJ SOLN
INTRAMUSCULAR | Status: AC
Start: 2019-12-26 — End: ?
  Filled 2019-12-26: qty 50

## 2019-12-26 MED ORDER — MIDAZOLAM HCL (PF) 2 MG/2ML IJ SOLN
INTRAMUSCULAR | Status: AC
Start: 2019-12-26 — End: ?
  Filled 2019-12-26: qty 2

## 2019-12-26 MED ORDER — SODIUM CHLORIDE 0.9 % IV SOLN
10.0000 mg | Freq: Once | INTRAVENOUS | Status: DC
Start: 2019-12-26 — End: 2019-12-26

## 2019-12-26 MED ORDER — FENTANYL CITRATE (PF) 100 MCG/2ML IJ SOLN
INTRAMUSCULAR | Status: AC
Start: 2019-12-26 — End: ?
  Filled 2019-12-26: qty 2

## 2019-12-26 MED ORDER — ACETAMINOPHEN 325 MG PO TABS
650.0000 mg | ORAL_TABLET | Freq: Four times a day (QID) | ORAL | Status: DC | PRN
Start: 2019-12-26 — End: 2019-12-27

## 2019-12-26 MED ORDER — FENTANYL CITRATE (PF) 250 MCG/5ML IJ SOLN
INTRAMUSCULAR | Status: DC | PRN
Start: 2019-12-26 — End: 2019-12-26
  Administered 2019-12-26: 50 ug via INTRAVENOUS
  Administered 2019-12-26 (×2): 25 ug via INTRAVENOUS
  Administered 2019-12-26: 50 ug via INTRAVENOUS
  Administered 2019-12-26: 25 ug via INTRAVENOUS

## 2019-12-26 MED ORDER — DEXAMETHASONE SODIUM PHOSPHATE 20 MG/5ML IJ SOLN
INTRAMUSCULAR | Status: AC
Start: 2019-12-26 — End: ?
  Filled 2019-12-26: qty 5

## 2019-12-26 MED ORDER — HEPARIN (PORCINE) IN NACL 1000 UNITS/500 ML 0.9% NACL IV SOLN (CUSTOM)
INTRAMUSCULAR | Status: AC | PRN
Start: 2019-12-26 — End: 2019-12-26
  Administered 2019-12-26: 10 mL/h via INTRAVENOUS

## 2019-12-26 MED ORDER — HEPARIN (PORCINE) IN NACL 1000-0.9 UT/500ML-% IV SOLN
INTRAVENOUS | Status: AC
Start: 2019-12-26 — End: ?
  Filled 2019-12-26: qty 500

## 2019-12-26 MED ORDER — SODIUM TETRADECYL SULFATE 3 % IV SOLN
INTRAVENOUS | Status: DC | PRN
Start: 2019-12-26 — End: 2019-12-26
  Administered 2019-12-26 (×2): 2 mL via INTRAVENOUS

## 2019-12-26 MED ORDER — HEPARIN (PORCINE) IN NACL 1000-0.9 UT/500ML-% IV SOLN
INTRAVENOUS | Status: AC
Start: 2019-12-26 — End: ?
  Filled 2019-12-26: qty 1500

## 2019-12-26 MED ORDER — SODIUM TETRADECYL SULFATE 1 % IV SOLN
INTRAVENOUS | Status: AC
Start: 2019-12-26 — End: ?
  Filled 2019-12-26: qty 6

## 2019-12-26 MED ORDER — DEXAMETHASONE SODIUM PHOSPHATE 4 MG/ML IJ SOLN (CUSTOM)
10.0000 mg | Freq: Once | INTRAMUSCULAR | Status: AC
Start: 2019-12-26 — End: 2019-12-26
  Administered 2019-12-26: 10 mg via INTRAVENOUS
  Filled 2019-12-26: qty 2.5

## 2019-12-26 MED ORDER — APIXABAN 5 MG PO TABS
5.0000 mg | ORAL_TABLET | Freq: Once | ORAL | Status: AC
Start: 2019-12-27 — End: 2019-12-27
  Administered 2019-12-27 (×2): 5 mg via ORAL
  Filled 2019-12-26: qty 1

## 2019-12-26 MED ORDER — SODIUM CHLORIDE 0.9 % IV SOLN
INTRAVENOUS | Status: DC
Start: 2019-12-26 — End: 2019-12-26

## 2019-12-26 MED ORDER — METOPROLOL SUCCINATE 25 MG OR TB24
50.0000 mg | ORAL_TABLET | Freq: Every day | ORAL | Status: DC
Start: 2019-12-26 — End: 2019-12-27
  Administered 2019-12-26: 50 mg via ORAL
  Filled 2019-12-26: qty 2

## 2019-12-26 MED ORDER — STERILE WATER FOR INJECTION IJ SOLN
3000.0000 mg | Freq: Once | INTRAMUSCULAR | Status: AC
Start: 2019-12-26 — End: 2019-12-26
  Administered 2019-12-26: 3000 mg via INTRAVENOUS
  Filled 2019-12-26: qty 3000

## 2019-12-26 MED ORDER — NALOXONE HCL 0.4 MG/ML IJ SOLN
0.1000 mg | Freq: Once | INTRAMUSCULAR | Status: DC | PRN
Start: 2019-12-26 — End: 2019-12-27

## 2019-12-26 SURGICAL SUPPLY — 9 items
BAG DECANTER (Misc Medical Supply) ×2 IMPLANT
COVER - ULTRASOUND PROBE (Drapes/towels) ×2 IMPLANT
DEVICE - FLOSWITCH (Disp Instruments) ×2 IMPLANT
DRAPE - FLAT WITH ADHESIVE STRIP (Drapes/towels) ×6 IMPLANT
DRESSING - 4 X 4 XRAY DETECTABLE SPONGES (Dressings/packing) ×2 IMPLANT
GLOVES BIOGEL 8.0 LTX (Gloves/Gowns) ×2 IMPLANT
K50 EXTENSION TUBING (Kits/Sets/Trays) ×2 IMPLANT
PACK - BODY IR (Procedure Packs/kits) ×2 IMPLANT
TOWELS, STERILE BLUE (Drapes/towels) ×2 IMPLANT

## 2019-12-26 NOTE — H&P (Signed)
Jamie Bartlett is a 54 year old male,   Scheduled for Procedure(s):  Image guided Right arm angiogram, possible embolization, possible sclerotherapy with moderate sedation, possible 23 hour observation, Treatment #2 on   12/26/2019 - POD Day of Surgery    PMHx:   Past Medical History:   Diagnosis Date   . A-fib (CMS-HCC)    . Acute deep vein thrombosis (DVT) of ulnar vein of right upper extremity (CMS-HCC)    . AVM (arteriovenous malformation)    . Deep vein thrombosis (DVT) of ulnar vein of right upper extremity, unspecified chronicity (CMS-HCC)      Surgical HX: No past surgical history on file.  Home meds:   Medications Prior to Admission   Medication Sig Dispense Refill Last Dose   . diclofenac (VOLTAREN) 1 % gel 4 GM APPLY ON THE SKIN TWICE A DAY AS NEEDED DISPENSE 500GM      . ELIQUIS 5 MG    12/25/2019 at 0800   . metoprolol succinate (TOPROL XL) 50 MG XL tablet    12/25/2019 at Unknown time     Current Inpatient Medications:  . ceFAZolin (ANCEF) IV  3,000 mg Once   . dexAMETHasone  10 mg Pre-Op Once     . sodium chloride           ALLERGIES:  No Known Allergies    History and Physical      Review of systems  Constitutional:  Denies Fever  HEENT:  Denies tinnitus  Cardiovascular:  Denies chest pain  Respiratory:  Denies difficulty breathing  Gastrointestinal:  Denies indigestion  Neurological:  Denies headache  Allergic/Immunologic:  Denies hives  Dermatological:  Denies Skin Rash    Physical Exam:  General Appearance:  Well appearing and No acute distress  HEENT:  EOMI  Respiratory:  Good chest wall excursion  Neurologic:  Awake, Alert and Oriented  GU:  Normal  GI:  Abdomen non-distended  Extremities:  No cyanosis, clubbing, or edema  ASA Classification: class 2 - patient with mild systemic disease        Mallampati score:  III - soft palate, base of uvula visible    Sedation to be used?: Yes    Mouth Opening:  Age appropriate  Dentition:  Normal  Jaw protrusion:  Normal  Thyromental distance:  Age appropriate  Neck  Anatomy:  Normal  C-spine mobility:  Full    History of difficult intubation:  No  Sedation Level Intended:  Moderate    Coags Reviewed: Yes          There were no vitals filed for this visit.    LABS:  No visits with results within 3 Day(s) from this visit.   Latest known visit with results is:   Admission on 12/05/2019, Discharged on 12/05/2019   Component Date Value Ref Range Status   . ACT K (iSTAT) POC 12/05/2019 213* 74 - 137 SEC. Final

## 2019-12-26 NOTE — Discharge Instructions (Signed)
[place], [place] [address]  [phone]    Sclerotherapy, Care After  This sheet gives you information about how to care for yourself after your procedure. Your health care provider may also give you more specific instructions. If you have problems or questions, contact your health care provider.    What can I expect after the procedure?  After the procedure, it is common to have:   Swelling.   Bruising.   Soreness.   Mild skin discoloration.   Slight bleeding from an injection site.    Follow these instructions at home:  Injection area care   Follow instructions from your health care provider about how to take care of your injection area. Make sure you:  ? Wash your hands with soap and water before you change your bandage (dressing). If soap and water are not available, use hand sanitizer.  ? Change your dressing as told by your health care provider.   Check your injection area every day for signs of infection. Check for:  ? Redness, swelling, or pain.  ? Fluid or blood.  ? Warmth.  ? Pus or a bad smell.    Activity   Do light exercise every day, as told by your health care provider. Walking or riding a stationary bike may be good options for you.   Return to your normal activities as told by your health care provider. Ask your health care provider what activities are safe for you.   Do not drive until your health care provider approves. Ask your health care provider when is safe for you to drive. You may need to wait 1-2 days before driving.    General instructions     Take over-the-counter and prescription medicines only as told by your health care provider.   Do not use lotions or creams on your legs unless your health care provider approves.   Do not use any products that contain nicotine or tobacco, such as cigarettes and e-cigarettes. If you need help quitting, ask your health care provider.   Do not take baths or showers, swim, or use a  hot tub until your health care provider approves. You may need to take sponge baths until 1-2 days after the procedure.   Wear compression stockings for a week, or as long as your health care provider recommends. These stockings help to prevent blood clots and reduce swelling in your legs.   Wear loose-fitting clothing on the treatment area.   Keep all follow-up visits as told by your health care provider. This is important.    Contact a health care provider if:   You have redness, swelling, or pain around an injection site.   You have fluid or blood coming from an injection site.   An injection area feels warm to the touch.   You have pus or a bad smell coming from an injection site.   You have a fever.    Get help right away if:   You faint.   You have severe pain.   You have leg pain that gets worse when you walk.   You have redness or swelling in your leg that is getting worse.   You have trouble breathing.   You cough up blood.    Summary   Swelling, bruising, and soreness are common after this procedure.   Check your injection area every day for signs of infection.   Make sure you wear  your compression stockings as told by your health care provider. These stockings help to prevent blood clots and reduce swelling in your legs.    This information is not intended to replace advice given to you by your health care provider. Make sure you discuss any questions you have with your health care provider.  Document Released: 06/09/2016 Document Revised: 06/09/2016 Document Reviewed: 06/09/2016  Elsevier Interactive Patient Education  2019 [employer]      Sedation    You received sedatives during your procedure.    The medications were:(drug/dose/time) 1. Fentanyl  2. Versed    You may feel sleepy/not yourself for several hours today. We suggest you remain with family or friends for rest of day.     Do not drive, make important decisions, or drink alcohol for 24 hours.      Moderate Conscious  Sedation, Adult, Care After  These instructions provide you with information about caring for yourself after your procedure. Your health care provider may also give you more specific instructions. Your treatment has been planned according to current medical practices, but problems sometimes occur. Call your health care provider if you have any problems or questions after your procedure.    What can I expect after the procedure?  After your procedure, it is common:   To feel sleepy for several hours.   To feel clumsy and have poor balance for several hours.   To have poor judgment for several hours.   To vomit if you eat too soon.    Follow these instructions at home:  For at least 24 hours after the procedure:     Do not:  ? Participate in activities where you could fall or become injured.  ? Drive.  ? Use heavy machinery.  ? Drink alcohol.  ? Take sleeping pills or medicines that cause drowsiness.  ? Make important decisions or sign legal documents.  ? Take care of children on your own.   Rest.    Eating and drinking   Follow the diet recommended by your health care provider.   If you vomit:  ? Drink water, juice, or soup when you can drink without vomiting.  ? Make sure you have little or no nausea before eating solid foods.    General instructions   Have a responsible adult stay with you until you are awake and alert.   Take over-the-counter and prescription medicines only as told by your health care provider.   If you smoke, do not smoke without supervision.   Keep all follow-up visits as told by your health care provider. This is important.    Contact a health care provider if:   You keep feeling nauseous or you keep vomiting.   You feel light-headed.   You develop a rash.   You have a fever.    Get help right away if:   You have trouble breathing.    This information is not intended to replace advice given to you by your health care provider. Make sure you discuss any questions you have with  your health care provider.  Document Released: 02/08/2013 Document Revised: 09/23/2015 Document Reviewed: 08/10/2015  Elsevier Interactive Patient Education  2019 [employer].

## 2019-12-26 NOTE — Brief Op Note (Signed)
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   -Successful direct puncture venogram of two additional sites along the ulnar aspect of the ventral forearm with sclerotherapy with 17mL Sotradecol foam (1:3 admixture with air)  -Several small areas where not amenable to percutaneous access given overlying neurovascular structures    SPECIMENS: None    Fluids/Blood Products:   - EBL: 95mL    DISPOSITION: Stable. Plan for 23 hours observation for pain control.  -Medrol dose pack  -Right forearm compression wrap (okay to remove right forearm IV)  -Neurovascular checks for 2hrs  -Regular diet and PO pain meds

## 2019-12-26 NOTE — RN OR/Procedure Note (Signed)
Debrief is to be conducted before the Attending Surgeon leaves the room:    Attending Surgeon, Anesthesiologist, IR nurse(s), IR tech(s) verbally confirm with the team:     1) Team verbally confirms procedure performed: right arm sclerotherapy under moderate sedation   2) Any equipment/implant problems addressed: none  3) Team reviews any key concerns for recovery and management of patient: none  4) Review of any specimens specific to the procedure: none  5) Intra-operative complication: none    Place ACE bandage around right arm, ok to DC IV  Regular diet   23 hour observation admit   Neurovascular assessments q2 hours

## 2019-12-26 NOTE — OR Nursing (Signed)
Nursing Sedation Completion    [place] NURSING SEDATION COMPLETION:   Sedation Level Achieved:  Moderate  Complications:  None  Patient tolerance:  Tolerated well, no immediate complications

## 2019-12-27 MED ORDER — METHYLPREDNISOLONE 4 MG OR KIT
ORAL_TABLET | ORAL | 0 refills | Status: AC
Start: 2019-12-27 — End: ?

## 2019-12-27 NOTE — Plan of Care (Signed)
Problem: Promotion of health and safety  Goal: Promotion of Health and Safety  Description: The patient remains safe, receives appropriate treatment and achieves optimal outcomes (physically, psychosocially, and spiritually) within the limitations of the disease process by discharge.  Outcome: Progressing  Flowsheets  Taken 12/27/2019 0449  Standard of Care/Policy: Medical/Surgical  Outcome Evaluation (rationale for progressing/not progressing) every shift: VSS, remains afebrile, denies any pain or discomfort, neurovascular checks on R arm.  Individualized Interventions/Recommendations (Discharge Readiness): Pt ambulating with steady gait, voiding spontaneously, tolerating PO intake, denies n/v.  Individualized Interventions/Recommendations (Skin/Comfort/Safety/Mobility): Pt self repositions, no bleeding from RFA noted, needs attended and met, fall / safety measures maintained.  Individualized Interventions/Recommendations (Knowledge): Pt able to verbalize understanding plan of care.  Individualized Interventions/Recommendations: Monitor for pain and medicate with PRN pain med as ordered.  Individualized Interventions/Recommendations: Encourage early ambulation.  Taken 12/26/2019 1915  Patient /Family stated Goal: To go home

## 2019-12-27 NOTE — Discharge Summary (Signed)
DISCHARGE SUMMARY    Patient Name:  Jamie Bartlett    Date of Admission:  12/26/2019  Date of Discharge:  12/27/2019    Principal Diagnosis:  Vascular malformation    Hospital Problem List:  Right forearm vascular malformation s/p sclerotherapy #2    Additional Hospital Diagnoses ("rule out" or "suspected" diagnoses, etc.):  None    Principal Procedure During This Hospitalization:  54 year old male with right forearm vascular malformation s/p sclerotherapy #2. Patient recovered well postprocedure and was amenable for discharge POD#1 with outpt follow up with Dr. Jacqulyn Bath.    Other Procedures Performed During This Hospitalization:  None    Procedure results are available in Chart Review in Epic.  For those providers external to [facility], the key procedure results are listed below:    Sclerotherapy #2:  -US demonstrates previously treated areas of patient's vascular malformation.   -Successful direct puncture venogram of two additional sites along the ulnar aspect of the ventral forearm with sclerotherapy with 2mL Sotradecol foam (1:3 admixture with air)  -Several small areas where not amenable to percutaneous access given overlying neurovascular structures    Consultations Obtained During This Hospitalization:  None    Reason for Admission to the Hospital / History of Present Illness:  54 year old male with right forearm vascular malformation s/p sclerotherapy #2. Patient recovered well postprocedure and was amenable for discharge POD#1 with outpt follow up with Dr. Jacqulyn Bath.    Hospital Events:  Patient recovered well with pain controlled with PO medications.    Follow Up Recommendations  Outpt follow up with Dr. Jacqulyn Bath to access for a 3rd treatment vs annual surveillance.    Tests Outstanding at Discharge Requiring Follow Up:    None    Discharge Condition:  Improved    Key Physical Exam Findings at Discharge:  Mental Status Exam:  Patient is alert and oriented to person, place, and time.  No significant physical examination  findings at the time of discharge. Right forearm wrapped in an Ace bandage.    Immunization History:    There is no immunization history on file for this patient.    Discharge Medications:     What To Do With Your Medications      START taking these medications      Add'l Info   methylPREDNISolone 4 MG tablet  Commonly known as: MEDROL DOSEPACK  For diagnoses: AVM (arteriovenous malformation)  Take as directed on package   Quantity: 1 each  Refills: 0        CONTINUE taking these medications      Add'l Info   diclofenac 1 % gel  Commonly known as: VOLTAREN  4 GM APPLY ON THE SKIN TWICE A DAY AS NEEDED DISPENSE 500GM   Refills: 0     Eliquis 5 MG  Take 5 mg by mouth every 12 hours.  Generic drug: apixaban   Refills: 0     metoprolol succinate 50 MG XL tablet  Commonly known as: TOPROL XL  Take 50 mg by mouth daily.   Refills: 0           Where to Get Your Medications      These medications were sent to [id number] - [place], [place] - [address]  [address], [place]  [address]    Phone: [phone]    methylPREDNISolone 4 MG tablet           Allergies:  No Known Allergies  MDRO: Negative    Discharge Disposition:  Home    Discharge Code Status:  Full code / full care   This code status is not changed from the time of admission.    Follow Up Appointments:  Scheduled appointments:  Future Appointments   Date Time Provider Department Center   01/15/2020 11:30 AM Long, [name], MD [place] [id number]       For appointments requested for after discharge that have not yet been scheduled, refer to the Post Discharge Referrals section of the After Visit Summary.    Discharging Physician's Contact Information:    Discharging Physician's Contact Information: [facility] at [phone]      Allyn Kenner, MD  12/27/19 7:52 AM

## 2019-12-27 NOTE — RN OR/Procedure Note (Signed)
Pt wheeled down to lobby by SHA, Ziqi. Pt approved to go home by IR MD, Dr. Willeen Cass. Pt given d/c instructions and pt is aware that he has a medication to pick up at his preferred pharmacy. Pt aware of right arm care instructions. VSS. Pt ambulated and tolerated diet. IV was removed. No bleeding noted from dressing.

## 2020-01-15 ENCOUNTER — Ambulatory Visit: Payer: BLUE CROSS/BLUE SHIELD | Attending: Interventional Radiology | Admitting: Interventional Radiology

## 2020-01-15 ENCOUNTER — Ambulatory Visit: Payer: BLUE CROSS/BLUE SHIELD | Admitting: Interventional Radiology

## 2020-01-15 DIAGNOSIS — Q2730 Arteriovenous malformation, site unspecified: Secondary | ICD-10-CM | POA: Insufficient documentation

## 2020-01-15 NOTE — Patient Instructions (Signed)
Please call Radiology at [phone] to schedule your MRA/MRV IMAGING.  If you have your imaging outside of [facility], please make sure to obtain a copy of the report and the images on a CD to hand carry to your next appointment.    Dr. Edwin Dada office will call to coordinate your follow up appointment after your MRA/MRV.    For scheduling:  Please call [phone], pick option 1, and option 2, to reach IR scheduling office.    For IR MD questions:  Please call [phone] or [phone] or [phone].    Fax # [phone] or [phone].

## 2020-01-15 NOTE — Progress Notes (Signed)
[name]. Darrin Koman, MD  Assistant Clinical Professor, Radiological Sciences  Vascular and Interventional Radiology  [facility]  [address] [place], [place] [address]  Appointments: [phone] Fax: [phone]    Follow Up     Date of Evaluation:January 15, 2020    Primary Care Physician: No Pcp, Per Patient    Attending Physician: [name]. Enolia Koepke, MD (A)    Service Provided: Outpatient Vascular & Interventional Radiology Follow-up Evaluation    Reason for Visit: chief complaint    History of Present Illness:   HPI: Jamie Bartlett is a 54 year old male with history of lumbar stenosis and atrial fibrillation diagnosed 03/2019 who presents with possible right forearm venous malformation. Patient underwent right arm angiogram and embolization on 12/05/2019 and 12/26/2019 and returns for follow up.    On initial visit:  Patient has been having pain/swelling at right arm since 07/2018 but did not seek medical attention until 01/2019. Ultrasound dated 01/20/2019 of right upper extremity showed DVT in the right ulnar vein. Subsequently the patient was started on Eliquis. Patient was referred to outside vascular surgeon Dr. [name] who ordered CTA. 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 Subsequently, the patient was referred to Dr. [name] of [facility]. On visit with them on 05/08/19, symptoms were improved with Eliquis. He denied history of trauma or repetitive overhand motions. Thrombophilia panel ordered in December 2020 was reviewed, found to be within normal limits. MR Venogram of RUE and [place] (DVT) of RUE were ordered and images imported into [facility]. Patient stated that his swelling worsens with exercise. Pain was localized more towards ulnar side.     On today's visit, the patient states that he has had 30-50% improvement in his swelling. He denies soreness of his arm or inability to move. The patient has finished  his course of steroids. The patient is asking if he can resume his exercise regime (lifting). The patient has used his compression sleeves until 2-3 days ago.    Past Medical History:  Past Medical History:   Diagnosis Date    A-fib (CMS-HCC)     Acute deep vein thrombosis (DVT) of ulnar vein of right upper extremity (CMS-HCC)     AVM (arteriovenous malformation)     Deep vein thrombosis (DVT) of ulnar vein of right upper extremity, unspecified chronicity (CMS-HCC)      Past Surgical History:   No past surgical history on file.    Home Medications:  No outpatient medications have been marked as taking for the 01/15/20 encounter (Appointment) with Aili Casillas, [name], MD.     Allergies:   No Known Allergies     Review of Systems: A 12-point review of systems was completed, reviewed, and pertinent positives and negatives noted in the Patient Health Survey Form archived in [facility].    Physical Exam:  General: No apparent distress.   Eyes: Sclerae anicteric. Moist conjunctivae. Pupils round regular and equal.  Ears, Nose, Throat: Ears and nose within normal limits. Oropharynx clear.   Neck: Full range of motion.   Respiratory: Normal respiratory effort.  Skin: No evidence of jaundice.   Upper Extremity: Right upper extremity still slightly swollen, limited examination due to telemedicine.  Neuro: Grossly nonfocal.    Functional Status:  independent    Diagnostics:  No results found for: ALB, ALT, AST, BUN, [place], CL, CHOL, CO2, CREAT, GFR, GFRAA,  GFRNON, GLU, HDL, HGB, A1C, LDL, MG, PHOS, PLT, PLCTEL, K, PSA, NA, SODIUM, TRIG, WBC    Imaging:  No relevant interval imaging acquired.    Assessment and Plan:  Jamie Bartlett is a 54 year old male with history of lumbar stenosis and atrial fibrillation diagnosed 03/2019 who presents with possible right forearm venous malformation. Patient underwent right arm angiogram and embolization on 12/05/2019 and 12/26/2019 and returns for follow up.    The patient is doing well today, with  improvement in his swelling compared to prior. We dicussed that the patient may resume working out and lifting, but that he should use his compression sleeve while doing so. We will continue to assess with imaging and will schedule for MRA/V approximately 2 months after his last procedure at the end of August 24th. Patient is amenable to this plan of care. All questions answered to the patient's satisfaction.     1. Will schedule for MRA/V of right upper extremity/forearm for end of October.   2. Will follow up after imaging is acquired ([facility] because patient lives far away).    Approximately 50 minutes were spent by the physician and nurse practitioner in reviewing imaging and test results, obtaining and reviewing history, performing a physical exam, counseling the patient and family, entering orders, and entering information into the patient's record.     Scribe Attestation  The notes I am recording reflect only actions made by and judgments taken by this provider, Dr. Jacqulyn Bath, for whom I am scribing today.  I have performed no independent clinical work.    Rhea Bleacher Chow    ____________________________________________________________________    Provider Attestation for Scribed Note    As the attending provider, I agree with the scribed content.  Any changes or edits are noted in the text above.    [name] Meyer Dockery

## 2020-08-02 ENCOUNTER — Telehealth: Payer: Self-pay

## 2020-08-02 NOTE — Telephone Encounter (Signed)
Called to verify completion of MRA scan and schedule IR clinic follow up with Dr. Jacqulyn Bath. Spoke with patient who informed me he would like to hold off on scheduling this as he is currently in the process of moving. Provided patient with IR scheduling number [phone] option 3, option 2 to schedule.

## 2020-09-25 DIAGNOSIS — M519 Unspecified thoracic, thoracolumbar and lumbosacral intervertebral disc disorder: Secondary | ICD-10-CM | POA: Insufficient documentation

## 2020-09-25 DIAGNOSIS — Z86718 Personal history of other venous thrombosis and embolism: Secondary | ICD-10-CM | POA: Insufficient documentation

## 2020-09-25 DIAGNOSIS — H518 Other specified disorders of binocular movement: Secondary | ICD-10-CM | POA: Insufficient documentation

## 2020-09-25 DIAGNOSIS — I480 Paroxysmal atrial fibrillation: Secondary | ICD-10-CM | POA: Insufficient documentation

## 2020-09-25 DIAGNOSIS — I10 Essential (primary) hypertension: Secondary | ICD-10-CM | POA: Insufficient documentation

## 2020-10-11 DIAGNOSIS — E782 Mixed hyperlipidemia: Secondary | ICD-10-CM | POA: Insufficient documentation

## 2021-01-19 DIAGNOSIS — I890 Lymphedema, not elsewhere classified: Secondary | ICD-10-CM | POA: Insufficient documentation

## 2021-01-19 NOTE — Progress Notes (Signed)
MRN : [id number]  Jerry Olsen is a 55 y.o. (Nov 26, 1965) male who presents with chief complaint of check swelling of arm.  History of Present Illness:   Location: Right forearm Character/quality of the symptom: Increasing size/swelling associated with a dull aching or discomfort Severity: Mild to moderate Duration: Worsening over the past 4 to 6 months Timing/onset: Continuous Aggravating/context: None Relieving/modifying: None  Patient relates that he initially was treated for this in [place].  He describes several CT scans as well as ultrasounds and from his history it sounds like he has undergone sclerotherapy.  A little bit vague as to whether this was intra-arterial or percutaneous.  He notes that after the treatment things did get better for a bit but now have gotten bigger again and are bothering him more.  No outpatient medications have been marked as taking for the 01/20/21 encounter (Appointment) with Delana Meyer, Dolores Lory, MD.    No past medical history on file.    Social History    Family History No family history on file.  Not on File   REVIEW OF SYSTEMS (Negative unless checked)  Constitutional: '[]'$ Weight loss  '[]'$ Fever  '[]'$ Chills Cardiac: '[]'$ Chest pain   '[]'$ Chest pressure   '[]'$ Palpitations   '[]'$ Shortness of breath when laying flat   '[]'$ Shortness of breath with exertion. Vascular:  '[]'$ Pain in legs with walking   '[]'$ Pain in legs at rest  '[]'$ History of DVT   '[]'$ Phlebitis   '[x]'$ Swelling in arm   '[]'$ Varicose veins   '[]'$ Non-healing ulcers Pulmonary:   '[]'$ Uses home oxygen   '[]'$ Productive cough   '[]'$ Hemoptysis   '[]'$ Wheeze  '[]'$ COPD   '[]'$ Asthma Neurologic:  '[]'$ Dizziness   '[]'$ Seizures   '[]'$ History of stroke   '[]'$ History of TIA  '[]'$ Aphasia   '[]'$ Vissual changes   '[]'$ Weakness or numbness in arm   '[]'$ Weakness or numbness in leg Musculoskeletal:   '[]'$ Joint swelling   '[]'$ Joint pain   '[]'$ Low back pain Hematologic:  '[]'$ Easy bruising  '[]'$ Easy bleeding   '[]'$ Hypercoagulable state   '[]'$ Anemic Gastrointestinal:   '[]'$ Diarrhea   '[]'$ Vomiting  '[]'$ Gastroesophageal reflux/heartburn   '[]'$ Difficulty swallowing. Genitourinary:  '[]'$ Chronic kidney disease   '[]'$ Difficult urination  '[]'$ Frequent urination   '[]'$ Blood in urine Skin:  '[]'$ Rashes   '[]'$ Ulcers  Psychological:  '[]'$ History of anxiety   '[]'$  History of major depression.  Physical Examination  There were no vitals filed for this visit. There is no height or weight on file to calculate BMI. Gen: WD/WN, NAD Head: [place]/AT, No temporalis wasting.  Ear/Nose/Throat: Hearing grossly intact, nares w/o erythema or drainage, pinna without lesions Eyes: PER, EOMI, sclera nonicteric.  Neck: Supple, no gross masses.  No JVD.  Pulmonary:  Good air movement, no audible wheezing, no use of accessory muscles.  Cardiac: RRR, precordium not hyperdynamic. Vascular: The volar aspect of his right forearm medially demonstrates a fullness which is palpable; 2+ soft pitting edema  Vessel Right Left  Radial Palpable Palpable  Gastrointestinal: soft, non-distended. No guarding/no peritoneal signs.  Musculoskeletal: M/S 5/5 throughout.  No deformity.  Neurologic: CN 2-12 intact. Pain and light touch intact in extremities.  Symmetrical.  Speech is fluent. Motor exam as listed above. Psychiatric: Judgment intact, Mood & affect appropriate for pt's clinical situation. Dermatologic: Venous rashes no ulcers noted.  No changes consistent with cellulitis. Lymph : No lichenification or skin changes of chronic lymphedema.  CBC No results found for: WBC, HGB, HCT, MCV, PLT  BMET No results found for: NA, K, CL, CO2, GLUCOSE, BUN, CREATININE, CALCIUM, GFRNONAA, GFRAA CrCl cannot  be calculated (No successful lab value found.).  COAG No results found for: INR, PROTIME  Radiology No results found.   Assessment/Plan 1. AV malformation, peripheral, congenital Patient likely has an AV malformation which appears to be congenital.  He has undergone treatment in the past but is now having recurrence  which I discussed with him is the typical course with respect to treatment of these lesions.  I have recommended that we get an MRI/MRA to better define this lesion and see if there are any identifiable arterial feeders at which time I did discuss angiography and embolization.  Patient agrees with this plan we will arrange the MRI annual follow-up in the office to review with me. - MR ANGIO UPPER EXTREMITY RIGHT W WO CONTRAST; Future  2. Primary hypertension Continue antihypertensive medications as already ordered, these medications have been reviewed and there are no changes at this time.   3. Hyperlipidemia, mixed Continue statin as ordered and reviewed, no changes at this time     Hortencia Pilar, MD  01/19/2021 8:13 PM

## 2021-01-20 ENCOUNTER — Ambulatory Visit (INDEPENDENT_AMBULATORY_CARE_PROVIDER_SITE_OTHER): Payer: Federal, State, Local not specified - PPO | Admitting: Vascular Surgery

## 2021-01-20 ENCOUNTER — Other Ambulatory Visit: Payer: Self-pay

## 2021-01-20 VITALS — BP 115/78 | HR 69 | Ht 74.0 in | Wt 295.0 lb

## 2021-01-20 DIAGNOSIS — E782 Mixed hyperlipidemia: Secondary | ICD-10-CM | POA: Diagnosis not present

## 2021-01-20 DIAGNOSIS — I10 Essential (primary) hypertension: Secondary | ICD-10-CM

## 2021-01-20 DIAGNOSIS — I890 Lymphedema, not elsewhere classified: Secondary | ICD-10-CM

## 2021-01-20 DIAGNOSIS — Q2730 Arteriovenous malformation, site unspecified: Secondary | ICD-10-CM | POA: Insufficient documentation

## 2021-01-21 ENCOUNTER — Encounter (INDEPENDENT_AMBULATORY_CARE_PROVIDER_SITE_OTHER): Payer: Self-pay | Admitting: Vascular Surgery

## 2021-02-10 ENCOUNTER — Ambulatory Visit: Payer: Federal, State, Local not specified - PPO | Admitting: Registered Nurse

## 2021-02-10 ENCOUNTER — Encounter: Payer: Self-pay | Admitting: *Deleted

## 2021-02-10 ENCOUNTER — Ambulatory Visit
Admission: RE | Admit: 2021-02-10 | Discharge: 2021-02-10 | Disposition: A | Discharge status: Home / Self Care | Payer: Federal, State, Local not specified - PPO | Attending: Gastroenterology | Admitting: Gastroenterology

## 2021-02-10 ENCOUNTER — Encounter
Admission: RE | Disposition: A | Discharge status: Home / Self Care | Payer: Self-pay | Source: Home / Self Care | Attending: Gastroenterology

## 2021-02-10 DIAGNOSIS — I480 Paroxysmal atrial fibrillation: Secondary | ICD-10-CM | POA: Diagnosis not present

## 2021-02-10 DIAGNOSIS — D120 Benign neoplasm of cecum: Secondary | ICD-10-CM | POA: Diagnosis not present

## 2021-02-10 DIAGNOSIS — Z7901 Long term (current) use of anticoagulants: Secondary | ICD-10-CM | POA: Diagnosis not present

## 2021-02-10 DIAGNOSIS — Z1211 Encounter for screening for malignant neoplasm of colon: Secondary | ICD-10-CM | POA: Insufficient documentation

## 2021-02-10 DIAGNOSIS — Z86718 Personal history of other venous thrombosis and embolism: Secondary | ICD-10-CM | POA: Insufficient documentation

## 2021-02-10 DIAGNOSIS — E785 Hyperlipidemia, unspecified: Secondary | ICD-10-CM | POA: Diagnosis not present

## 2021-02-10 DIAGNOSIS — K640 First degree hemorrhoids: Secondary | ICD-10-CM | POA: Diagnosis not present

## 2021-02-10 DIAGNOSIS — I10 Essential (primary) hypertension: Secondary | ICD-10-CM | POA: Diagnosis not present

## 2021-02-10 HISTORY — DX: Essential (primary) hypertension: I10

## 2021-02-10 HISTORY — DX: Spermatocele of epididymis, unspecified: N43.40

## 2021-02-10 HISTORY — DX: Other specified disorders of binocular movement: H51.8

## 2021-02-10 HISTORY — DX: Unspecified thoracic, thoracolumbar and lumbosacral intervertebral disc disorder: M51.9

## 2021-02-10 HISTORY — PX: COLONOSCOPY: SHX5424

## 2021-02-10 HISTORY — DX: Arteriovenous malformation, site unspecified: Q27.30

## 2021-02-10 HISTORY — DX: Long term (current) use of anticoagulants: Z79.01

## 2021-02-10 HISTORY — DX: Acute embolism and thrombosis of unspecified deep veins of unspecified lower extremity: I82.409

## 2021-02-10 HISTORY — DX: Paroxysmal atrial fibrillation: I48.0

## 2021-02-10 HISTORY — DX: Hyperlipidemia, unspecified: E78.5

## 2021-02-10 SURGERY — COLONOSCOPY
Anesthesia: Monitor Anesthesia Care

## 2021-02-10 MED ORDER — PROPOFOL 500 MG/50ML IV EMUL
INTRAVENOUS | Status: DC | PRN
  Administered 2021-02-10: 150 ug/kg/min via INTRAVENOUS

## 2021-02-10 MED ORDER — SODIUM CHLORIDE 0.9 % IV SOLN: INTRAVENOUS | Status: DC

## 2021-02-10 MED ORDER — PROPOFOL 10 MG/ML IV BOLUS
INTRAVENOUS | Status: DC | PRN
  Administered 2021-02-10: 100 mg via INTRAVENOUS

## 2021-02-10 MED ORDER — PHENYLEPHRINE HCL (PRESSORS) 10 MG/ML IV SOLN
INTRAVENOUS | Status: DC | PRN
  Administered 2021-02-10 (×2): 100 ug via INTRAVENOUS

## 2021-02-10 MED ORDER — GLYCOPYRROLATE 0.2 MG/ML IJ SOLN
INTRAMUSCULAR | Status: DC | PRN
  Administered 2021-02-10: .2 mg via INTRAVENOUS

## 2021-02-10 MED ORDER — PROPOFOL 500 MG/50ML IV EMUL
INTRAVENOUS | Status: AC
  Filled 2021-02-10: qty 50

## 2021-02-10 MED ORDER — LIDOCAINE HCL (CARDIAC) PF 100 MG/5ML IV SOSY
PREFILLED_SYRINGE | INTRAVENOUS | Status: DC | PRN
  Administered 2021-02-10: 40 mg via INTRAVENOUS

## 2021-02-10 NOTE — Transfer of Care (Signed)
Immediate Anesthesia Transfer of Care Note  Patient: Jerry Olsen  Procedure(s) Performed: COLONOSCOPY  Patient Location: PACU and Endoscopy Unit  Anesthesia Type:General  Level of Consciousness: drowsy  Airway & Oxygen Therapy: Patient Spontanous Breathing  Post-op Assessment: Report given to RN and Post -op Vital signs reviewed and stable  Post vital signs: Reviewed and stable  Last Vitals:  Vitals Value Taken Time  BP 94/53 02/10/21 1413  Temp 36 C 02/10/21 1413  Pulse 75 02/10/21 1416  Resp 10 02/10/21 1416  SpO2 97 % 02/10/21 1416  Vitals shown include unvalidated device data.  Last Pain:  Vitals:   02/10/21 1413  TempSrc: Temporal  PainSc: Asleep         Complications: No notable events documented.

## 2021-02-10 NOTE — Anesthesia Procedure Notes (Addendum)
Date/Time: 02/10/2021 1:44 PM Performed by: Doreen Salvage, CRNA Pre-anesthesia Checklist: Patient identified, Emergency Drugs available, Suction available and Patient being monitored Patient Re-evaluated:Patient Re-evaluated prior to induction Oxygen Delivery Method: Nasal cannula Induction Type: IV induction Dental Injury: Teeth and Oropharynx as per pre-operative assessment  Comments: Nasal cannula with etCO2 monitoring

## 2021-02-10 NOTE — Anesthesia Preprocedure Evaluation (Signed)
Anesthesia Evaluation  Patient identified by MRN, date of birth, ID band Patient awake    Reviewed: Allergy & Precautions, NPO status , Patient's Chart, lab work & pertinent test results  History of Anesthesia Complications Negative for: history of anesthetic complications  Airway Mallampati: III  TM Distance: >3 FB Neck ROM: Full    Dental no notable dental hx. (+) Teeth Intact   Pulmonary neg pulmonary ROS,    Pulmonary exam normal breath sounds clear to auscultation       Cardiovascular Exercise Tolerance: Good METS: 3 - Mets hypertension (Controlled), Pt. on medications and Pt. on home beta blockers Normal cardiovascular exam+ dysrhythmias (frate controlled) Atrial Fibrillation  Rhythm:Regular Rate:Normal     Neuro/Psych negative neurological ROS  negative psych ROS   GI/Hepatic negative GI ROS, Neg liver ROS,   Endo/Other  negative endocrine ROS  Renal/GU negative Renal ROS  negative genitourinary   Musculoskeletal negative musculoskeletal ROS (+)   Abdominal   Peds  Hematology negative hematology ROS (+)   Anesthesia Other Findings   Reproductive/Obstetrics negative OB ROS                             Anesthesia Physical Anesthesia Plan  ASA: 3  Anesthesia Plan: MAC   Post-op Pain Management:    Induction: Intravenous  PONV Risk Score and Plan:   Airway Management Planned: Natural Airway and Nasal Cannula  Additional Equipment:   Intra-op Plan:   Post-operative Plan:   Informed Consent: I have reviewed the patients History and Physical, chart, labs and discussed the procedure including the risks, benefits and alternatives for the proposed anesthesia with the patient or authorized representative who has indicated his/her understanding and acceptance.     Dental Advisory Given  Plan Discussed with: Anesthesiologist, CRNA and Surgeon  Anesthesia Plan Comments:  (Patient consented for risks of anesthesia including but not limited to:  - adverse reactions to medications - damage to eyes, teeth, lips or other oral mucosa - nerve damage due to positioning  - sore throat or hoarseness - Damage to heart, brain, nerves, lungs, other parts of body or loss of life  Patient voiced understanding.)        Anesthesia Quick Evaluation

## 2021-02-10 NOTE — Interval H&P Note (Signed)
History and Physical Interval Note: Preprocedure H&P from 02/10/21  was reviewed and there was no interval change after seeing and examining the patient.  Written consent was obtained from the patient after discussion of risks, benefits, and alternatives. Patient has consented to proceed with Colonoscopy with possible intervention   02/10/2021 1:37 PM  Jerry Olsen  has presented today for surgery, with the diagnosis of (Z12.11) COLON CANCER SCREENING.  The various methods of treatment have been discussed with the patient and family. After consideration of risks, benefits and other options for treatment, the patient has consented to  Procedure(s): COLONOSCOPY (N/A) as a surgical intervention.  The patient's history has been reviewed, patient examined, no change in status, stable for surgery.  I have reviewed the patient's chart and labs.  Questions were answered to the patient's satisfaction.     Annamaria Helling

## 2021-02-10 NOTE — H&P (Signed)
[facility] Pre-Procedure H&P   Patient ID: Jerry Olsen is a 55 y.o. male.  Gastroenterology Provider: Annamaria Helling, DO  Referring Provider: Octavia Bruckner, PA PCP: Adin Hector, MD  Date: 02/10/2021  HPI Jerry Olsen is a 55 y.o. male who presents today for Colonoscopy for initial screening colonoscopy.  Here for initial screening colonsocopy. No fhx of crc or colon polyps. No gi sx.  On eliquis for a fib. Last dose on Wednesday or Thursday of last week. Risk stratification- low risk by Dr. Nehemiah Massed  No other acute gi complaints.  Past Medical History:  Diagnosis Date   AV malformation, peripheral, congenital    Chronic anticoagulation    DVT (deep venous thrombosis) (HCC)    Dysconjugate gaze    Hyperlipidemia    Hypertension    Lumbar disc disease    Paroxysmal atrial fibrillation (HCC)    Spermatocele     Past Surgical History:  Procedure Laterality Date   ENDOSCOPIC LUMBAR DISCECTOMY W/ LASER     endovenous ablation arm vein Right    VASECTOMY      Family History No h/o GI disease or malignancy  Review of Systems  Constitutional:  Negative for activity change, appetite change, chills, fatigue, fever and unexpected weight change.  HENT:  Negative for trouble swallowing and voice change.   Respiratory:  Negative for shortness of breath.   Cardiovascular:  Negative for chest pain and palpitations.  Gastrointestinal:  Negative for abdominal distention, abdominal pain, anal bleeding, blood in stool, constipation, diarrhea, nausea and vomiting.  Musculoskeletal:  Negative for arthralgias and myalgias.  Skin:  Negative for color change and pallor.  Neurological:  Negative for dizziness, syncope and weakness.  Psychiatric/Behavioral:  Negative for confusion. The patient is not nervous/anxious.   All other systems reviewed and are negative.   Medications No current facility-administered medications on file prior to encounter.   Current  Outpatient Medications on File Prior to Encounter  Medication Sig Dispense Refill   Multiple Vitamins-Minerals (CENTRUM SILVER PO) Take by mouth daily. (Patient not taking: Reported on 02/10/2021)      Pertinent medications related to GI and procedure were reviewed by me with the patient prior to the procedure   Current Facility-Administered Medications:    0.9 %  sodium chloride infusion, , Intravenous, Continuous, Annamaria Helling, DO, Last Rate: 20 mL/hr at 02/10/21 1256, New Bag at 02/10/21 1256  sodium chloride 20 mL/hr at 02/10/21 1256       Not on File Allergies were reviewed by me prior to the procedure  Objective    Vitals:   02/10/21 1243  BP: 126/87  Pulse: 69  Resp: 18  Temp: (!) 96.6 F (35.9 C)  TempSrc: Temporal  SpO2: 99%  Weight: 131.5 kg  Height: 6\' 2"  (1.88 m)     Physical Exam Vitals and nursing note reviewed.  Constitutional:      General: He is not in acute distress.    Appearance: Normal appearance. He is obese. He is not toxic-appearing or diaphoretic.  HENT:     Head: Normocephalic and atraumatic.     Nose: Nose normal.     Mouth/Throat:     Mouth: Mucous membranes are moist.     Pharynx: Oropharynx is clear.  Eyes:     General: No scleral icterus. Cardiovascular:     Rate and Rhythm: Normal rate and regular rhythm.     Heart sounds: Normal heart sounds. No murmur heard.  No friction rub. No gallop.  Pulmonary:     Effort: Pulmonary effort is normal. No respiratory distress.     Breath sounds: Normal breath sounds. No wheezing, rhonchi or rales.  Abdominal:     General: Bowel sounds are normal. There is no distension.     Palpations: Abdomen is soft.     Tenderness: There is no abdominal tenderness. There is no guarding or rebound.  Musculoskeletal:     Cervical back: Neck supple.     Right lower leg: No edema.     Left lower leg: No edema.  Skin:    General: Skin is warm and dry.     Coloration: Skin is not jaundiced or  pale.  Neurological:     General: No focal deficit present.     Mental Status: He is alert and oriented to person, place, and time. Mental status is at baseline.  Psychiatric:        Mood and Affect: Mood normal.        Behavior: Behavior normal.        Thought Content: Thought content normal.        Judgment: Judgment normal.     Assessment:  Jerry Olsen is a 55 y.o. male  who presents today for Colonoscopy for initial screening colonoscopy.  Plan:  Colonoscopy with possible intervention today  Colonoscopy with possible biopsy, control of bleeding, polypectomy, and interventions as necessary has been discussed with the patient/patient representative. Informed consent was obtained from the patient/patient representative after explaining the indication, nature, and risks of the procedure including but not limited to death, bleeding, perforation, missed neoplasm/lesions, cardiorespiratory compromise, and reaction to medications. Opportunity for questions was given and appropriate answers were provided. Patient/patient representative has verbalized understanding is amenable to undergoing the procedure.  Annamaria Helling, DO  [facility]  Portions of the record may have been created with voice recognition software. Occasional wrong-word or 'sound-a-like' substitutions may have occurred due to the inherent limitations of voice recognition software.  Read the chart carefully and recognize, using context, where substitutions may have occurred.

## 2021-02-10 NOTE — Op Note (Addendum)
[facility] Patient Name: Jerry Olsen Procedure Date: 02/10/2021 1:21 PM MRN: [id number] Account #: [id number] Date of Birth: May 08, 1965 Admit Type: Outpatient Age: 55 Room: [facility] ENDO ROOM 2 Gender: Male Note Status: Finalized Instrument Name: Colonscope [id number] Procedure:             Colonoscopy Indications:           Screening for colorectal malignant neoplasm Providers:             Rueben Bash, DO Referring MD:          Ramonita Lab, MD (Referring MD) Medicines:             Monitored Anesthesia Care Complications:         No immediate complications. Estimated blood loss:                         Minimal., Bradycardia which responded to medication                         and scope manuevering. Procedure:             Pre-Anesthesia Assessment:                        - Prior to the procedure, a History and Physical was                         performed, and patient medications and allergies were                         reviewed. The patient is competent. The risks and                         benefits of the procedure and the sedation options and                         risks were discussed with the patient. All questions                         were answered and informed consent was obtained.                         Patient identification and proposed procedure were                         verified by the physician, the nurse, the anesthetist                         and the technician in the endoscopy suite. Mental                         Status Examination: alert and oriented. Airway                         Examination: normal oropharyngeal airway and neck                         mobility. Respiratory Examination: clear to  auscultation. CV Examination: RRR, no murmurs, no S3                         or S4. Prophylactic Antibiotics: The patient does not                         require prophylactic antibiotics. Prior                          Anticoagulants: The patient has taken no previous                         anticoagulant or antiplatelet agents. ASA Grade                         Assessment: III - A patient with severe systemic                         disease. After reviewing the risks and benefits, the                         patient was deemed in satisfactory condition to                         undergo the procedure. The anesthesia plan was to use                         monitored anesthesia care (MAC). Immediately prior to                         administration of medications, the patient was                         re-assessed for adequacy to receive sedatives. The                         heart rate, respiratory rate, oxygen saturations,                         blood pressure, adequacy of pulmonary ventilation, and                         response to care were monitored throughout the                         procedure. The physical status of the patient was                         re-assessed after the procedure.                        After obtaining informed consent, the colonoscope was                         passed under direct vision. Throughout the procedure,                         the patient's blood pressure, pulse, and oxygen  saturations were monitored continuously. The                         Colonoscope was introduced through the anus and                         advanced to the the terminal ileum, with                         identification of the appendiceal orifice and IC                         valve. The colonoscopy was performed without                         difficulty. The patient tolerated the procedure well.                         The quality of the bowel preparation was evaluated                         using the BBPS [facility] Bowel Preparation Scale) with                         scores of: Right Colon = 3, Transverse Colon = 3 and                          Left Colon = 3 (entire mucosa seen well with no                         residual staining, small fragments of stool or opaque                         liquid). The total BBPS score equals 9. The terminal                         ileum, ileocecal valve, appendiceal orifice, and                         rectum were photographed. Findings:      The perianal and digital rectal examinations were normal. Pertinent       negatives include normal sphincter tone.      The terminal ileum appeared normal.      A 1 to 2 mm polyp was found in the cecum. The polyp was sessile. The       polyp was removed with a cold biopsy forceps. Resection and retrieval       were complete. Estimated blood loss was minimal.      Non-bleeding internal hemorrhoids were found during retroflexion. The       hemorrhoids were Grade I (internal hemorrhoids that do not prolapse).       Estimated blood loss: none.      The exam was otherwise without abnormality on direct and retroflexion       views. Impression:            - The examined portion of the ileum was normal.                        -  One 1 to 2 mm polyp in the cecum, removed with a                         cold biopsy forceps. Resected and retrieved.                        - Non-bleeding internal hemorrhoids.                        - The examination was otherwise normal on direct and                         retroflexion views. Recommendation:        - Discharge patient to home.                        - Resume previous diet.                        - Continue present medications.                        - Await pathology results.                        - Repeat colonoscopy for surveillance based on                         pathology results.                        - Return to referring physician as previously                         scheduled.                        - Resume Eliquis (apixaban) at prior dose tomorrow.                         Refer to referring  physician for further adjustment of                         therapy. Procedure Code(s):     --- Professional ---                        [phone], Colonoscopy, flexible; with biopsy, single or                         multiple Diagnosis Code(s):     --- Professional ---                        Z12.11, Encounter for screening for malignant neoplasm                         of colon                        K64.0, First degree hemorrhoids  K63.5, Polyp of colon CPT copyright 2019 American Medical Association. All rights reserved. The codes documented in this report are preliminary and upon coder review may  be revised to meet current compliance requirements. Attending Participation:      I personally performed the entire procedure. Volney American, DO Annamaria Helling DO, DO 02/10/2021 2:13:32 PM This report has been signed electronically. Number of Addenda: 0 Note Initiated On: 02/10/2021 1:21 PM Scope Withdrawal Time: 0 hours 11 minutes 0 seconds  Total Procedure Duration: 0 hours 19 minutes 23 seconds  Estimated Blood Loss:  Estimated blood loss was minimal.      [facility]

## 2021-02-11 ENCOUNTER — Encounter: Payer: Self-pay | Admitting: Gastroenterology

## 2021-02-12 LAB — SURGICAL PATHOLOGY

## 2021-02-19 ENCOUNTER — Other Ambulatory Visit (INDEPENDENT_AMBULATORY_CARE_PROVIDER_SITE_OTHER): Payer: Self-pay | Admitting: Vascular Surgery

## 2021-02-19 ENCOUNTER — Telehealth (INDEPENDENT_AMBULATORY_CARE_PROVIDER_SITE_OTHER): Payer: Self-pay

## 2021-02-19 DIAGNOSIS — Q2730 Arteriovenous malformation, site unspecified: Secondary | ICD-10-CM

## 2021-02-19 NOTE — Telephone Encounter (Signed)
Patient left a voicemail stating that his authorization had expired for MRA and was needing a new for him to be schedule. Patient has been made aware that authorization is done and is ready to be schedule.

## 2021-02-28 ENCOUNTER — Telehealth (INDEPENDENT_AMBULATORY_CARE_PROVIDER_SITE_OTHER): Payer: Self-pay

## 2021-02-28 NOTE — Anesthesia Postprocedure Evaluation (Signed)
Anesthesia Post Note  Patient: Jerry Olsen  Procedure(s) Performed: COLONOSCOPY  Patient location during evaluation: PACU Anesthesia Type: MAC Level of consciousness: awake and alert Pain management: pain level controlled Vital Signs Assessment: post-procedure vital signs reviewed and stable Respiratory status: spontaneous breathing, nonlabored ventilation, respiratory function stable and patient connected to nasal cannula oxygen Cardiovascular status: stable and blood pressure returned to baseline Postop Assessment: no apparent nausea or vomiting Anesthetic complications: no   No notable events documented.   Last Vitals:  Vitals:   02/10/21 1423 02/10/21 1433  BP: (!) 98/54 (!) 99/56  Pulse: 68 75  Resp: 18 15  Temp:    SpO2: 97% 99%    Last Pain:  Vitals:   02/10/21 1443  TempSrc:   PainSc: 0-No pain                 Tonny Bollman

## 2021-02-28 NOTE — Telephone Encounter (Signed)
The  pt called and left a VM on the nurses line wanting to make [place] aware that he has got his MRI scheduled for Nov 10 th at 8:30 AM so he will need to be scheduled for a MRI follow up per his chart. Please schedule

## 2021-02-28 NOTE — Telephone Encounter (Signed)
Pt states that he is having his MRI on 11.10.22 and Dr. Delana Meyer was going to order him a sedative. [facility] [address].

## 2021-03-03 NOTE — Telephone Encounter (Signed)
I left a note with the provider to send I the sedative for the pt's MRI.

## 2021-03-03 NOTE — Telephone Encounter (Signed)
I called pt's Rx for Versed 8 Mg into [facility] [address]  per his chart  he is to take it po 1/2 hour prior to MRI per Dr. Delana Meyer.

## 2021-03-13 ENCOUNTER — Ambulatory Visit
Admission: RE | Admit: 2021-03-13 | Discharge: 2021-03-13 | Disposition: A | Discharge status: Home / Self Care | Payer: Federal, State, Local not specified - PPO | Source: Ambulatory Visit | Attending: Vascular Surgery | Admitting: Vascular Surgery

## 2021-03-13 ENCOUNTER — Other Ambulatory Visit: Payer: Self-pay

## 2021-03-13 DIAGNOSIS — Q2730 Arteriovenous malformation, site unspecified: Secondary | ICD-10-CM | POA: Diagnosis present

## 2021-03-13 IMAGING — MR MR MRA EXTREM UP WO/W CM*R*
38 of 47 series · 38 of 47 positions shown · IV contrast (10ml Gadavist)
Comparison: None.

CLINICAL DATA: RIGHT upper extremity swelling. Eval for
arteriovenous malformation (AVM). Reported surgery/procedure 2 years
prior.

EXAM:
MRA OF THE RIGHT UPPER EXTREMITY WITH AND WITHOUT CONTRAST
TECHNIQUE: Multiplanar, multisequence MR imaging of the RIGHT upper extremity
was performed both before and after administration of intravenous
contrast.
CONTRAST:  10mL GADAVIST GADOBUTROL 1 MMOL/ML IV SOLN

[Series 3: T1 dynamic · axial · right · 3.0mm · 0.94mm/px · 1 of 112 slices shown (1 of 3)]
[im 1/112]
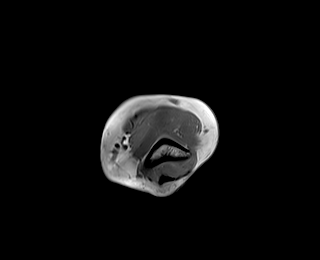

[Series 4: T1 dynamic · axial · right · 3.0mm · 0.94mm/px · 1 of 112 slices shown (2 of 3)]
[im 1/112]
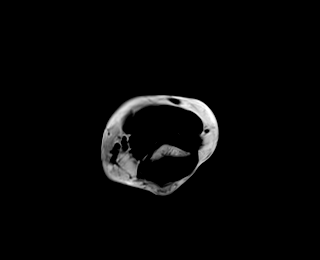

[Series 5: T1 dynamic · axial · right · 3.0mm · 0.94mm/px · 1 of 112 slices shown (3 of 3)]
[im 1/112]
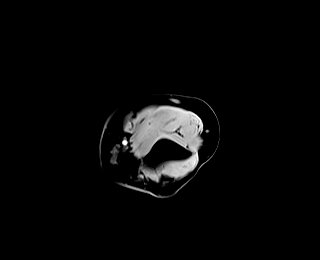

[Series 6: bSSFP · axial · right · 4.0mm · 0.59mm/px · 1 of 85 slices shown (1 of 2)]
[im 1/85]
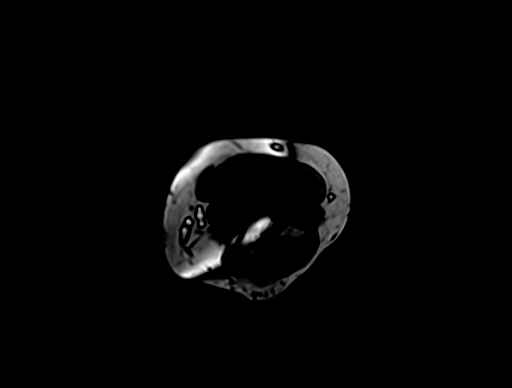

[Series 7: bSSFP · coronal · right · 4.0mm · 0.78mm/px · 1 of 28 slices shown (2 of 2)]
[im 1/28]
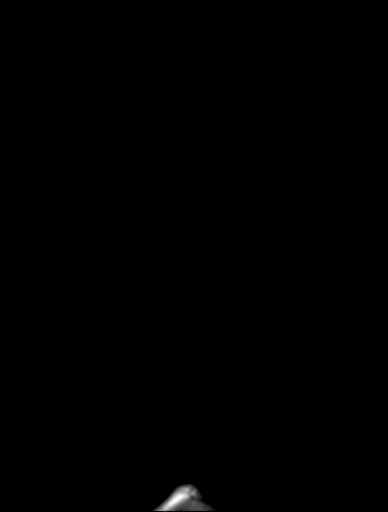

[Series 9: cor twist · coronal · right · 1.0mm · 1.04mm/px · 1 of 96 slices shown (1 of 18)]
[im 1/96]
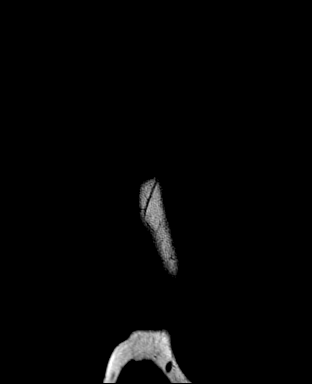

[Series 9: cor twist · coronal · right · 1.0mm · 1.04mm/px · 1 of 96 slices shown (2 of 18)]
[im 1/96]
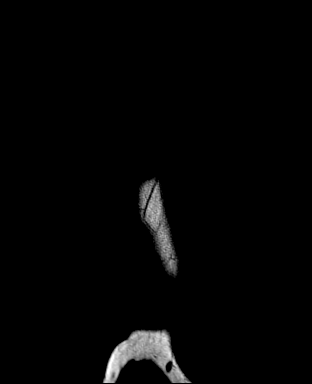

[Series 9: cor twist · coronal · right · 1.0mm · 1.04mm/px · 1 of 96 slices shown (3 of 18)]
[im 1/96]
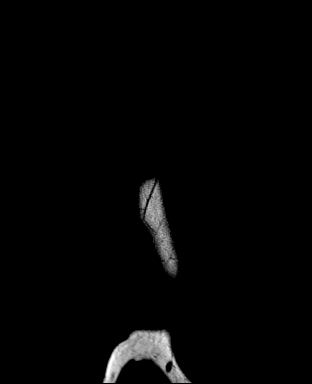

[Series 9: cor twist · coronal · right · 1.0mm · 1.04mm/px · 1 of 96 slices shown (4 of 18)]
[im 1/96]
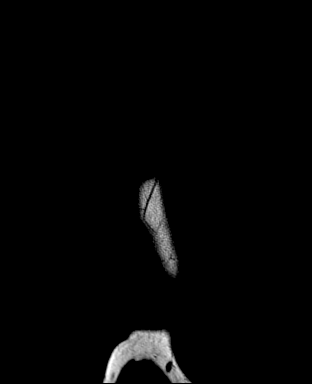

[Series 9: cor twist · coronal · right · 1.0mm · 1.04mm/px · 1 of 96 slices shown (5 of 18)]
[im 1/96]
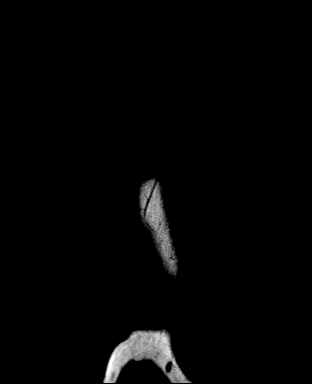

[Series 9: cor twist · coronal · right · 1.0mm · 1.04mm/px · 1 of 96 slices shown (6 of 18)]
[im 1/96]
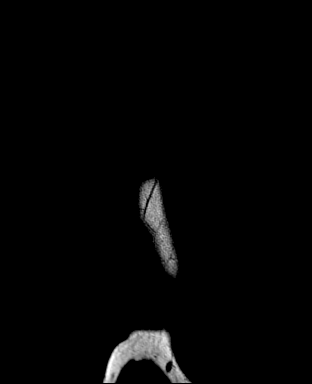

[Series 9: cor twist · coronal · right · 1.0mm · 1.04mm/px · 1 of 96 slices shown (7 of 18)]
[im 1/96]
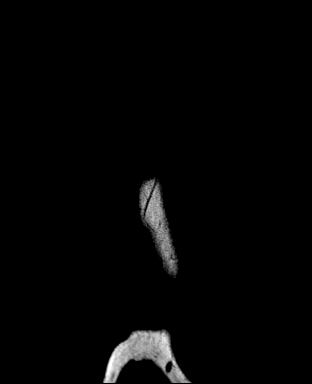

[Series 9: cor twist · coronal · right · 1.0mm · 1.04mm/px · 1 of 96 slices shown (8 of 18)]
[im 1/96]
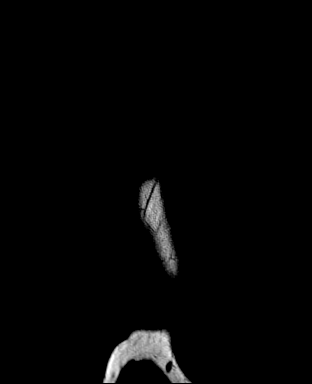

[Series 9: cor twist · coronal · right · 1.0mm · 1.04mm/px · 1 of 96 slices shown (9 of 18)]
[im 1/96]
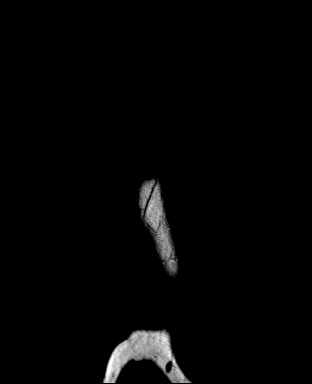

[Series 9: cor twist · coronal · right · 1.0mm · 1.04mm/px · 1 of 96 slices shown (10 of 18)]
[im 1/96]
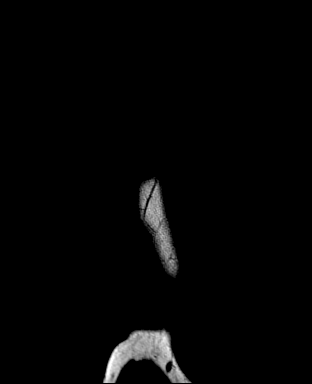

[Series 9: cor twist · coronal · right · 1.0mm · 1.04mm/px · 1 of 96 slices shown (11 of 18)]
[im 1/96]
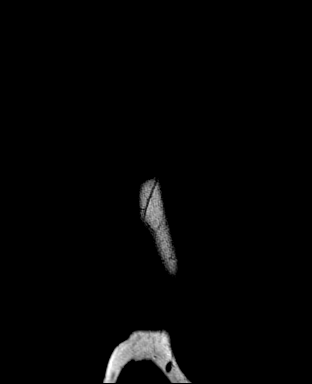

[Series 9: cor twist · coronal · right · 1.0mm · 1.04mm/px · 1 of 96 slices shown (12 of 18)]
[im 1/96]
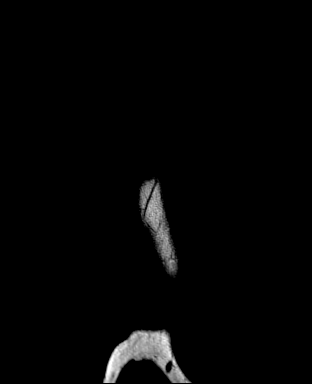

[Series 9: cor twist · coronal · right · 1.0mm · 1.04mm/px · 1 of 96 slices shown (13 of 18)]
[im 1/96]
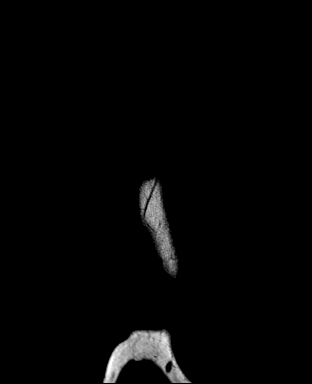

[Series 9: cor twist · coronal · right · 1.0mm · 1.04mm/px · 1 of 96 slices shown (14 of 18)]
[im 1/96]
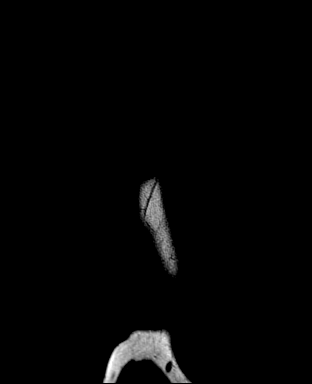

[Series 9: cor twist · coronal · right · 1.0mm · 1.04mm/px · 1 of 96 slices shown (15 of 18)]
[im 1/96]
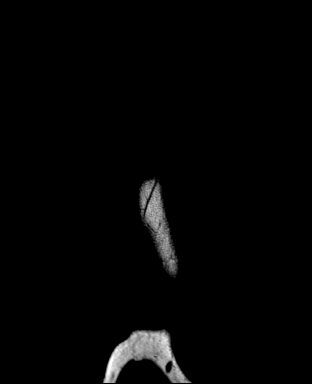

[Series 9: cor twist · coronal · right · 1.0mm · 1.04mm/px · 1 of 96 slices shown (16 of 18)]
[im 1/96]
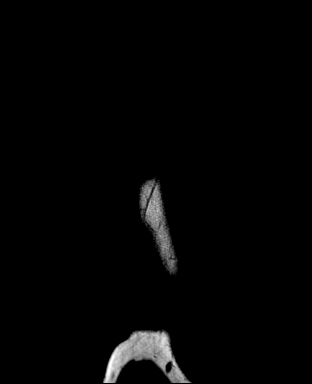

[Series 9: cor twist · coronal · right · 1.0mm · 1.04mm/px · 1 of 96 slices shown (17 of 18)]
[im 1/96]
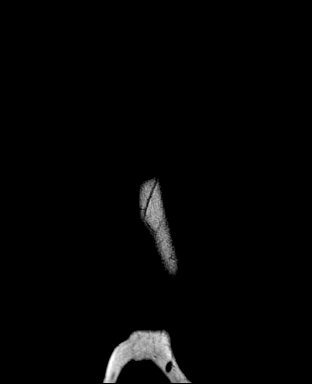

[Series 9: cor twist · coronal · right · 1.0mm · 1.04mm/px · 1 of 96 slices shown (18 of 18)]
[im 1/96]
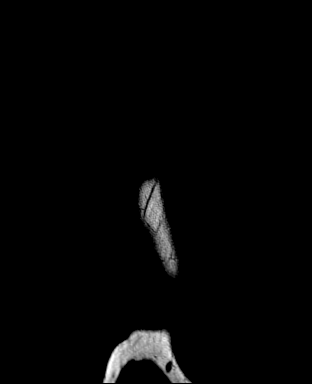

[Series 10: cor twist_sub · coronal · right · 1.0mm · 1.04mm/px · 1 of 94 slices shown (1 of 15)]
[im 1/94]
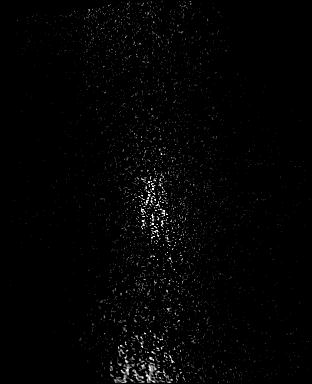

[Series 10: cor twist_sub · coronal · right · 1.0mm · 1.04mm/px · 1 of 96 slices shown (2 of 15)]
[im 1/96]
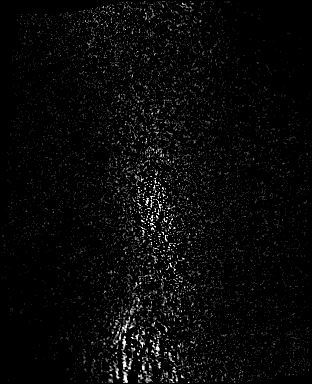

[Series 10: cor twist_sub · coronal · right · 1.0mm · 1.04mm/px · 1 of 95 slices shown (3 of 15)]
[im 1/95]
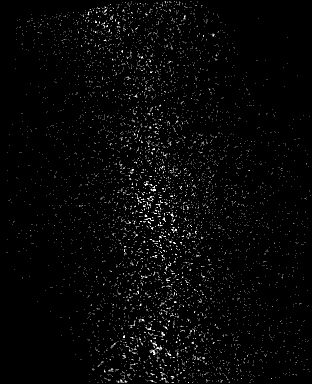

[Series 10: cor twist_sub · coronal · right · 1.0mm · 1.04mm/px · 1 of 90 slices shown (4 of 15)]
[im 1/90]
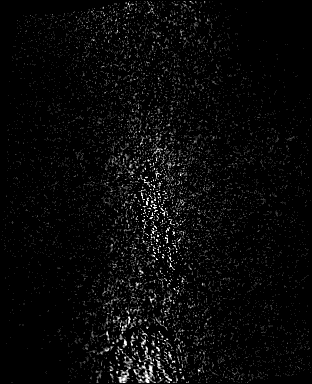

[Series 10: cor twist_sub · coronal · right · 1.0mm · 1.04mm/px · 1 of 89 slices shown (5 of 15)]
[im 1/89]
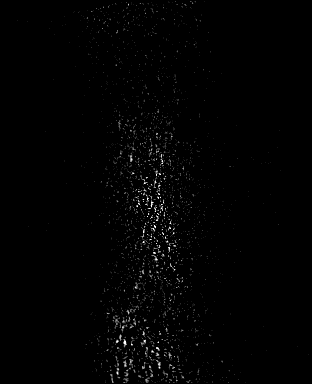

[Series 10: cor twist_sub · coronal · right · 1.0mm · 1.04mm/px · 1 of 95 slices shown (6 of 15)]
[im 1/95]
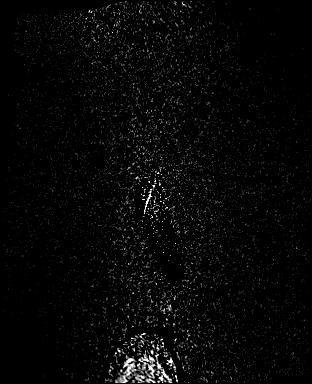

[Series 10: cor twist_sub · coronal · right · 1.0mm · 1.04mm/px · 1 of 94 slices shown (7 of 15)]
[im 1/94]
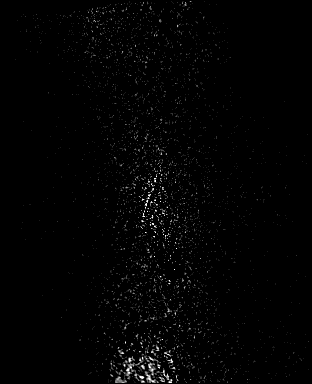

[Series 10: cor twist_sub · coronal · right · 1.0mm · 1.04mm/px · 1 of 94 slices shown (8 of 15)]
[im 1/94]
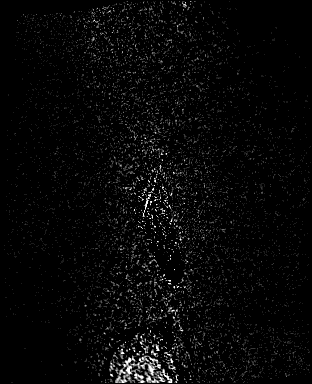

[Series 10: cor twist_sub · coronal · right · 1.0mm · 1.04mm/px · 1 of 94 slices shown (9 of 15)]
[im 1/94]
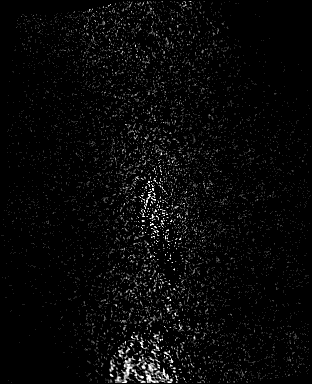

[Series 10: cor twist_sub · coronal · right · 1.0mm · 1.04mm/px · 1 of 92 slices shown (10 of 15)]
[im 1/92]
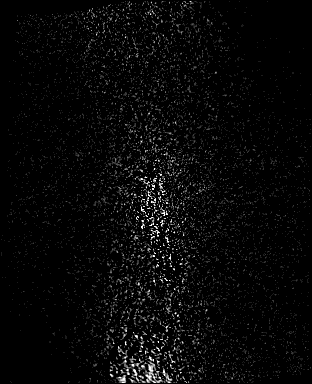

[Series 10: cor twist_sub · coronal · right · 1.0mm · 1.04mm/px · 1 of 94 slices shown (11 of 15)]
[im 1/94]
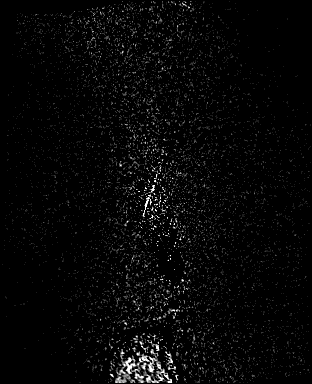

[Series 10: cor twist_sub · coronal · right · 1.0mm · 1.04mm/px · 1 of 96 slices shown (12 of 15)]
[im 1/96]
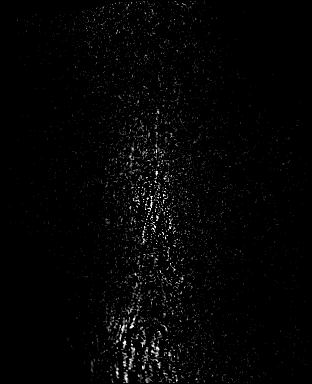

[Series 10: cor twist_sub · coronal · right · 1.0mm · 1.04mm/px · 1 of 87 slices shown (13 of 15)]
[im 1/87]
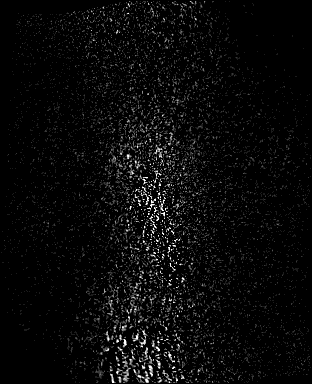

[Series 10: cor twist_sub · coronal · right · 1.0mm · 1.04mm/px · 1 of 95 slices shown (14 of 15)]
[im 1/95]
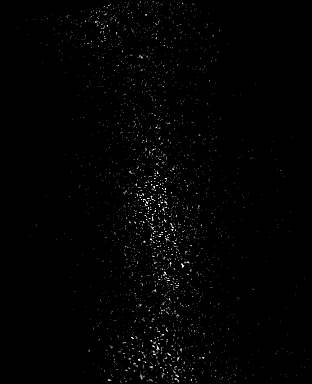

[Series 10: cor twist_sub · coronal · right · 1.0mm · 1.04mm/px · 1 of 95 slices shown (15 of 15)]
[im 1/95]
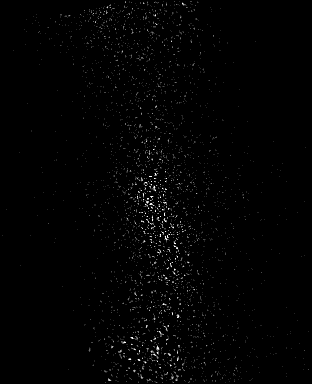

[38 of 47 positions shown; findings below may reference images not displayed]

FINDINGS: Bones

Normal appearance of the RIGHT upper extremity forearm bone marrow.

The examination was not protocol to evaluate for joint cartilage or
ligaments

Soft tissues

*Within the volar forearm are multiple serpiginous vessels, with
inflow originating from the ulnar artery and outflow via the basilic
vein, with feeders from the median vein of the forearm.
*Imaging consistent with arteriovenous malformation, with enhancing
intramuscular components and delayed versus non-enhancement of the
deeper segments of the malformation suggesting prior treatment,
likely with embolization
*No discrete nidus, however the dominant segment measures
approximately 9.0 x 0.8 cm (coronal versus axial). See key image.
*Increased girth of the RIGHT distal forearm.
*Dominant, direct communication with venous outflow via venous
tributaries into the basilic and median vein of the forearm. See key
image. Imaging appearance FLO to high output fistula.
IMPRESSION: 1. RIGHT upper extremity arteriovenous malformation with evidence of
prior treatment, likely embolization, of the deeper component as
above.
2. RIGHT distal forearm increased girth and evidence of persistent
enhancement of a dominant segment within intramuscular components,
without discrete nidus.
3. Direct communication with venous outflow via tributaries into the
basilic and median veins of the forearm. Suspect/question
high-output fistulization, correlate with echocardiogram.

## 2021-03-13 MED ORDER — GADOBUTROL 1 MMOL/ML IV SOLN
10.0000 mL | Freq: Once | INTRAVENOUS | Status: AC | PRN
  Administered 2021-03-13: 10 mL via INTRAVENOUS

## 2021-03-17 ENCOUNTER — Encounter (INDEPENDENT_AMBULATORY_CARE_PROVIDER_SITE_OTHER): Payer: Self-pay | Admitting: Vascular Surgery

## 2021-03-17 ENCOUNTER — Other Ambulatory Visit: Payer: Self-pay

## 2021-03-17 ENCOUNTER — Ambulatory Visit (INDEPENDENT_AMBULATORY_CARE_PROVIDER_SITE_OTHER): Payer: Federal, State, Local not specified - PPO | Admitting: Vascular Surgery

## 2021-03-17 VITALS — BP 132/84 | HR 77 | Ht 74.0 in | Wt 301.0 lb

## 2021-03-17 DIAGNOSIS — Q2730 Arteriovenous malformation, site unspecified: Secondary | ICD-10-CM | POA: Diagnosis not present

## 2021-03-17 DIAGNOSIS — E782 Mixed hyperlipidemia: Secondary | ICD-10-CM

## 2021-03-17 DIAGNOSIS — I480 Paroxysmal atrial fibrillation: Secondary | ICD-10-CM

## 2021-03-17 DIAGNOSIS — I10 Essential (primary) hypertension: Secondary | ICD-10-CM | POA: Diagnosis not present

## 2021-03-17 NOTE — Progress Notes (Signed)
MRN : [id number]  Jerry Olsen is a 55 y.o. ([birth year]) male who presents with chief complaint of follow up for MRI.  History of Present Illness:   Patient returns to discuss the results of his MRI.  He continues to have some mild symptoms of his right forearm but states that these are minor and not disrupting his lifestyle.    No outpatient medications have been marked as taking for the 03/17/21 encounter (Appointment) with Delana Meyer, Dolores Lory, MD.    Past Medical History:  Diagnosis Date   AV malformation, peripheral, congenital    Chronic anticoagulation    DVT (deep venous thrombosis) ([place])    Dysconjugate gaze    Hyperlipidemia    Hypertension    Lumbar disc disease    Paroxysmal atrial fibrillation ([place])    Spermatocele     Past Surgical History:  Procedure Laterality Date   COLONOSCOPY N/A 02/10/2021   Procedure: COLONOSCOPY;  Surgeon: Annamaria Helling, DO;  [facility] ENDOSCOPY;  Service: Gastroenterology;  Laterality: N/A;   ENDOSCOPIC LUMBAR DISCECTOMY W/ LASER     endovenous ablation arm vein Right    VASECTOMY      Social History Social History   Tobacco Use   Smoking status: Never   Smokeless tobacco: Never  Vaping Use   Vaping Use: Never used  Substance Use Topics   Alcohol use: Yes    Comment: occassionally   Drug use: Never    Family History No family history on file.  Not on File   REVIEW OF SYSTEMS (Negative unless checked)  Constitutional: [] Weight loss  [] Fever  [] Chills Cardiac: [] Chest pain   [] Chest pressure   [] Palpitations   [] Shortness of breath when laying flat   [] Shortness of breath with exertion. Vascular:  [] Pain in legs with walking   [] Pain in legs at rest  [] History of DVT   [] Phlebitis   [] Swelling in legs   [] Varicose veins   [] Non-healing ulcers Pulmonary:   [] Uses home oxygen   [] Productive cough   [] Hemoptysis   [] Wheeze  [] COPD   [] Asthma Neurologic:  [] Dizziness   [] Seizures   [] History of stroke    [] History of TIA  [] Aphasia   [] Vissual changes   [] Weakness or numbness in arm   [] Weakness or numbness in leg Musculoskeletal:   [] Joint swelling   [] Joint pain   [] Low back pain Hematologic:  [] Easy bruising  [] Easy bleeding   [] Hypercoagulable state   [] Anemic Gastrointestinal:  [] Diarrhea   [] Vomiting  [] Gastroesophageal reflux/heartburn   [] Difficulty swallowing. Genitourinary:  [] Chronic kidney disease   [] Difficult urination  [] Frequent urination   [] Blood in urine Skin:  [] Rashes   [] Ulcers  Psychological:  [] History of anxiety   []  History of major depression.  Physical Examination  There were no vitals filed for this visit. There is no height or weight on file to calculate BMI. Gen: WD/WN, NAD Head: /AT, No temporalis wasting.  Ear/Nose/Throat: Hearing grossly intact, nares w/o erythema or drainage Eyes: PER, EOMI, sclera nonicteric.  Neck: Supple, no masses.  No bruit or JVD.  Pulmonary:  Good air movement, no audible wheezing, no use of accessory muscles.  Cardiac: RRR, normal S1, S2, no Murmurs. Vascular:   Right forearm is full compared to the left it is enlarged compared to the left it is nontender.  No thrill associated with the forearm Vessel Right Left  Radial Palpable Palpable  Gastrointestinal: soft, non-distended. No guarding/no peritoneal signs.  Musculoskeletal: M/S 5/5 throughout.  No visible deformity.  Neurologic: CN 2-12 intact. Pain and light touch intact in extremities.  Symmetrical.  Speech is fluent. Motor exam as listed above. Psychiatric: Judgment intact, Mood & affect appropriate for pt's clinical situation. Dermatologic: No rashes or ulcers noted.  No changes consistent with cellulitis.   CBC No results found for: WBC, HGB, HCT, MCV, PLT  BMET No results found for: NA, K, CL, CO2, GLUCOSE, BUN, CREATININE, CALCIUM, GFRNONAA, GFRAA CrCl cannot be calculated (No successful lab value found.).  COAG No results found for: INR,  PROTIME  Radiology MR ANGIO UPPER EXTREMITY RIGHT W WO CONTRAST  Result Date: 03/13/2021 CLINICAL DATA:  RIGHT upper extremity swelling. Eval for arteriovenous malformation (AVM). Reported surgery/procedure 2 years prior. EXAM: MRA OF THE RIGHT UPPER EXTREMITY WITH AND WITHOUT CONTRAST TECHNIQUE: Multiplanar, multisequence MR imaging of the RIGHT upper extremity was performed both before and after administration of intravenous contrast. CONTRAST:  76mL GADAVIST GADOBUTROL 1 MMOL/ML IV SOLN COMPARISON:  None. FINDINGS: Bones Normal appearance of the RIGHT upper extremity forearm bone marrow. The examination was not protocol to evaluate for joint cartilage or ligaments Soft tissues *Within the volar forearm are multiple serpiginous vessels, with inflow originating from the ulnar artery and outflow via the basilic vein, with feeders from the median vein of the forearm. *Imaging consistent with arteriovenous malformation, with enhancing intramuscular components and delayed versus non-enhancement of the deeper segments of the malformation suggesting prior treatment, likely with embolization *No discrete nidus, however the dominant segment measures approximately 9.0 x 0.8 cm (coronal versus axial). See key image. *Increased girth of the RIGHT distal forearm. *Dominant, direct communication with venous outflow via venous tributaries into the basilic and median vein of the forearm. See key image. Imaging appearance akin to high output fistula. IMPRESSION: 1. RIGHT upper extremity arteriovenous malformation with evidence of prior treatment, likely embolization, of the deeper component as above. 2. RIGHT distal forearm increased girth and evidence of persistent enhancement of a dominant segment within intramuscular components, without discrete nidus. 3. Direct communication with venous outflow via tributaries into the basilic and median veins of the forearm. 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 Michaelle Birks, MD [facility] Radiology Electronically Signed   By: Michaelle Birks M.D.   On: 03/13/2021 10:37     Assessment/Plan 1. AV malformation, peripheral, congenital I have personally reviewed the MR scan.  This confirms that we are dealing with an AV malformation.  It appears to have branches feeding it from the ulnar artery.  There is an extensive venous plexus with high flow noted as well.  Given these findings I have discussed with the patient angiography with embolization.  I do not believe treating this percutaneously from the venous approach is going to have a significant and lasting effect.  I also discussed the benign nature of the AV malformation.  At this point patient wishes to continue to observe.  He will follow-up with me on an as-needed basis should the AV malformation become more symptomatic.   A total of 35 minutes was spent in preparation for this visit as well as directly with this patient and greater than 50% was spent in counseling and coordination of care with the patient.  Discussion included the treatment options for vascular disease including indications for surgery and intervention.  Also discussed is the appropriate timing of treatment.  In addition medical therapy was discussed.   2. Paroxysmal atrial fibrillation (HCC) Continue antiarrhythmia medications as already ordered, these  medications have been reviewed and there are no changes at this time.  Continue anticoagulation as ordered by Cardiology Service   3. Primary hypertension Continue antihypertensive medications as already ordered, these medications have been reviewed and there are no changes at this time.   4. Hyperlipidemia, mixed Continue statin as ordered and reviewed, no changes at this time      Hortencia Pilar, MD  03/17/2021 8:54 AM

## 2021-05-01 ENCOUNTER — Ambulatory Visit: Payer: Federal, State, Local not specified - PPO | Admitting: Dermatology

## 2021-05-01 ENCOUNTER — Encounter: Payer: Self-pay | Admitting: Dermatology

## 2021-05-01 ENCOUNTER — Other Ambulatory Visit: Payer: Self-pay

## 2021-05-01 DIAGNOSIS — L608 Other nail disorders: Secondary | ICD-10-CM | POA: Diagnosis not present

## 2021-05-01 DIAGNOSIS — L309 Dermatitis, unspecified: Secondary | ICD-10-CM

## 2021-05-01 DIAGNOSIS — L304 Erythema intertrigo: Secondary | ICD-10-CM

## 2021-05-01 DIAGNOSIS — R21 Rash and other nonspecific skin eruption: Secondary | ICD-10-CM

## 2021-05-01 MED ORDER — TRIAMCINOLONE ACETONIDE 0.1 % EX OINT: 1.0000 "application " | TOPICAL_OINTMENT | Freq: Two times a day (BID) | CUTANEOUS | 1 refills | Status: DC

## 2021-05-01 MED ORDER — KETOCONAZOLE 2 % EX CREA: TOPICAL_CREAM | CUTANEOUS | 0 refills | Status: DC

## 2021-05-01 MED ORDER — HYDROCORTISONE 2.5 % EX CREA: TOPICAL_CREAM | CUTANEOUS | 0 refills | Status: DC

## 2021-05-01 NOTE — Patient Instructions (Addendum)
If You Need Anything After Your Visit ° °If you have any questions or concerns for your doctor, please call our main line at [phone] and press option 4 to reach your doctor's medical assistant. If no one answers, please leave a voicemail as directed and we will return your call as soon as possible. Messages left after 4 pm will be answered the following business day.  ° °You may also send us a message via MyChart. We typically respond to MyChart messages within 1-2 business days. ° °For prescription refills, please ask your pharmacy to contact our office. Our fax number is [phone]. ° °If you have an urgent issue when the clinic is closed that cannot wait until the next business day, you can page your doctor at the number below.   ° °Please note that while we do our best to be available for urgent issues outside of office hours, we are not available 24/7.  ° °If you have an urgent issue and are unable to reach us, you may choose to seek medical care at your doctor's office, retail clinic, urgent care center, or emergency room. ° °If you have a medical emergency, please immediately call 911 or go to the emergency department. ° °Pager Numbers ° °- Dr. Kowalski: [phone] ° °- Dr. Moye: [phone] ° °- Dr. Stewart: [phone] ° °In the event of inclement weather, please call our main line at [phone] for an update on the status of any delays or closures. ° °Dermatology Medication Tips: °Please keep the boxes that topical medications come in in order to help keep track of the instructions about where and how to use these. Pharmacies typically print the medication instructions only on the boxes and not directly on the medication tubes.  ° °If your medication is too expensive, please contact our office at [phone] option 4 or send us a message through MyChart.  ° °We are unable to tell what your co-pay for medications will be in advance as this is different depending on your insurance coverage.  However, we may be able to find a substitute medication at lower cost or fill out paperwork to get insurance to cover a needed medication.  ° °If a prior authorization is required to get your medication covered by your insurance company, please allow us 1-2 business days to complete this process. ° °Drug prices often vary depending on where the prescription is filled and some pharmacies may offer cheaper prices. ° °The website www.goodrx.com contains coupons for medications through different pharmacies. The prices here do not account for what the cost may be with help from insurance (it may be cheaper with your insurance), but the website can give you the price if you did not use any insurance.  °- You can print the associated coupon and take it with your prescription to the pharmacy.  °- You may also stop by our office during regular business hours and pick up a GoodRx coupon card.  °- If you need your prescription sent electronically to a different pharmacy, notify our office through [facility] MyChart or by phone at [phone] option 4. ° ° ° ° °Si Usted Necesita Algo Después de Su Visita ° °También puede enviarnos un mensaje a través de MyChart. Por lo general respondemos a los mensajes de MyChart en el transcurso de 1 a 2 días hábiles. ° °Para renovar recetas, por favor pida a su farmacia que se ponga en contacto con nuestra oficina. Nuestro número de fax es el [phone]. ° °Si tiene   un asunto urgente cuando la clnica est cerrada y que no puede esperar hasta el siguiente da hbil, puede llamar/localizar a su doctor(a) al nmero que aparece a continuacin.   Por favor, tenga en cuenta que aunque hacemos todo lo posible para estar disponibles para asuntos urgentes fuera del horario de [place], no estamos disponibles las 24 horas del da, los 7 das de la [place].   Si tiene un problema urgente y no puede comunicarse con nosotros, puede optar por buscar atencin mdica  en el consultorio de su  doctor(a), en una clnica privada, en un centro de atencin urgente o en una sala de emergencias.  Si tiene Engineering geologist, por favor llame inmediatamente al 911 o vaya a la sala de emergencias.  Nmeros de bper  - Dr. Nehemiah Massed: [phone]  - Dra. Moye: [phone]  - Dra. Nicole Kindred: [phone]  En caso de inclemencias del [place], por favor llame a Johnsie Kindred principal al [phone] para una actualizacin sobre el [place] de cualquier retraso o cierre.  Consejos para la medicacin en dermatologa: Por favor, guarde las cajas en las que vienen los medicamentos de uso tpico para ayudarle a seguir las instrucciones sobre dnde y cmo usarlos. Las farmacias generalmente imprimen las instrucciones del medicamento slo en las cajas y no directamente en los tubos del [place].   Si su medicamento es muy caro, por favor, pngase en contacto con Zigmund Daniel llamando al [phone] y presione la opcin 4 o envenos un mensaje a travs de Pharmacist, community.   No podemos decirle cul ser su copago por los medicamentos por adelantado ya que esto es diferente dependiendo de la cobertura de su seguro. Sin embargo, es posible que podamos encontrar un medicamento sustituto a Electrical engineer un formulario para que el seguro cubra el medicamento que se considera necesario.   Si se requiere una autorizacin previa para que su compaa de seguros [place] su medicamento, por favor permtanos de 1 a 2 das hbiles para completar este proceso.  Los precios de los medicamentos varan con frecuencia dependiendo del Environmental consultant de dnde se surte la receta y alguna farmacias pueden ofrecer precios ms baratos.  El sitio web www.goodrx.com tiene cupones para medicamentos de Airline pilot. Los precios aqu no tienen en cuenta lo que podra costar con la ayuda del seguro (puede ser ms barato con su seguro), pero el sitio web puede darle el precio si no utiliz Research scientist (physical sciences).  - Puede imprimir el cupn  correspondiente y llevarlo con su receta a la farmacia.  - Tambin puede pasar por nuestra oficina durante el horario de atencin regular y Charity fundraiser una tarjeta de cupones de GoodRx.  - Si necesita que su receta se enve electrnicamente a una farmacia diferente, informe a nuestra oficina a travs de MyChart de [place] o por telfono llamando al [phone] y presione la opcin 4. Gentle Skin Care Guide  1. Bathe no more than once a day.  2. Avoid bathing in hot water  3. Use a mild soap like Dove, Vanicream, Cetaphil, CeraVe. Can use Lever 2000 or Cetaphil antibacterial soap  4. Use soap only where you need it. On most days, use it under your arms, between your legs, and on your feet. Let the water rinse other areas unless visibly dirty.  5. When you get out of the bath/shower, use a towel to gently blot your skin dry, don't rub it.  6. While your skin is still a little damp, apply a moisturizing cream such as  Vanicream, CeraVe, Cetaphil, Eucerin, Sarna lotion or plain Vaseline Jelly. For hands apply Neutrogena [place] Hand Cream or Excipial Hand Cream.  7. Reapply moisturizer any time you start to itch or feel dry.  8. Sometimes using free and clear laundry detergents can be helpful. Fabric softener sheets should be avoided. Downy Free & Gentle liquid, or any liquid fabric softener that is free of dyes and perfumes, it acceptable to use  9. If your doctor has given you prescription creams you may apply moisturizers over them

## 2021-05-01 NOTE — Progress Notes (Signed)
New Patient Visit  Subjective  Jerry Olsen is a 55 y.o. male who presents for the following: Rash (Started about a month ago under the R axilla, abdomen, lower legs, and back. Patient c/o persistent itching and has tried HC 1% put states it only helped for a few minutes. Patient states no new or changing laundry detergent, washes, soaps, etc., or new medications. Wife does not have a rash or itching, and they do have a dog that lives inside and goes outside. ).  The following portions of the chart were reviewed this encounter and updated as appropriate:   Tobacco   Allergies   Meds   Problems   Med Hx   Surg Hx   Fam Hx       Review of Systems:  No other skin or systemic complaints except as noted in HPI or Assessment and Plan.  Objective  Well appearing patient in no apparent distress; mood and affect are within normal limits.  A focused examination was performed including the trunk and extremities. Relevant physical exam findings are noted in the Assessment and Plan.  Right Axilla Erythematous patch  B/L toenail Toenail thickening with ventral pterygium at R 4th toenail, ventral pteryigum L 1st toenail   Trunk, extremities Scattered excoriated erythematous papules coalescing to plaques.     Assessment & Plan  Erythema intertrigo Right Axilla  Woods lamp test negative -   Intertrigo is a chronic recurrent rash that occurs in skin fold areas that may be associated with friction; heat; moisture; yeast; fungus; and bacteria.  It is exacerbated by increased movement / activity; sweating; and higher atmospheric temperature.  Start Ketoconazole 2% cream QD and HC 2.5% cream BID up to two weeks.   Recommend antiperspirants and antifungal powders to keep areas dry.   hydrocortisone 2.5 % cream - Right Axilla Apply to rash under the right arm BID up to two weeks.  ketoconazole (NIZORAL) 2 % cream - Right Axilla Apply to right axilla QD.  Eczema, unspecified type  Pterygium  of nail B/L toenail  Possibly secondary to lichen planus or other inflammatory process. Stable for years, likely no treatment needed.  Recommend following up with Dr. Nehemiah Massed to get his input on toenails.  Rash and other nonspecific skin eruption Trunk, extremities  DDX eczema vs ACD over hypersensitivity - consider bx if not responding as expected to eczema treatment.   Recommend mild soap and moisturizing cream 1-2 times daily.  Gentle skin care handout provided.   Start TMC 0.1% cream to aa's BID up to two weeks then PRN thereafter. Avoid applying to face, groin, and axilla. Use as directed. Long-term use can cause thinning of the skin.   Topical steroids (such as triamcinolone, fluocinolone, fluocinonide, mometasone, clobetasol, halobetasol, betamethasone, hydrocortisone) can cause thinning and lightening of the skin if they are used for too long in the same area. Your physician has selected the right strength medicine for your problem and area affected on the body. Please use your medication only as directed by your physician to prevent side effects.    triamcinolone ointment (KENALOG) 0.1 % - Trunk, extremities Apply 1 application topically 2 (two) times daily. Apply to aa's rash BID up to two weeks. Avoid the face, groin, and axilla.   Return for rash and toenail f/u in 4-8 weeks with Dr. Nehemiah Massed.  Luther Redo, CMA, am acting as scribe for Forest Gleason, MD .  Documentation: I have reviewed the above documentation for accuracy and completeness,  and I agree with the above.  Forest Gleason, MD

## 2021-05-28 ENCOUNTER — Emergency Department
Admission: EM | Admit: 2021-05-28 | Discharge: 2021-05-28 | Disposition: A | Discharge status: Home / Self Care | Payer: Federal, State, Local not specified - PPO | Attending: Emergency Medicine | Admitting: Emergency Medicine

## 2021-05-28 ENCOUNTER — Emergency Department: Payer: Federal, State, Local not specified - PPO

## 2021-05-28 ENCOUNTER — Encounter: Payer: Self-pay | Admitting: Emergency Medicine

## 2021-05-28 ENCOUNTER — Other Ambulatory Visit: Payer: Self-pay

## 2021-05-28 DIAGNOSIS — M7989 Other specified soft tissue disorders: Secondary | ICD-10-CM | POA: Insufficient documentation

## 2021-05-28 DIAGNOSIS — R0600 Dyspnea, unspecified: Secondary | ICD-10-CM | POA: Insufficient documentation

## 2021-05-28 DIAGNOSIS — R0602 Shortness of breath: Secondary | ICD-10-CM | POA: Diagnosis not present

## 2021-05-28 DIAGNOSIS — R0789 Other chest pain: Secondary | ICD-10-CM | POA: Insufficient documentation

## 2021-05-28 DIAGNOSIS — R079 Chest pain, unspecified: Secondary | ICD-10-CM

## 2021-05-28 DIAGNOSIS — R9431 Abnormal electrocardiogram [ECG] [EKG]: Secondary | ICD-10-CM | POA: Insufficient documentation

## 2021-05-28 DIAGNOSIS — I10 Essential (primary) hypertension: Secondary | ICD-10-CM | POA: Insufficient documentation

## 2021-05-28 DIAGNOSIS — R110 Nausea: Secondary | ICD-10-CM | POA: Diagnosis not present

## 2021-05-28 DIAGNOSIS — I2089 Other forms of angina pectoris: Secondary | ICD-10-CM | POA: Insufficient documentation

## 2021-05-28 LAB — PROTIME-INR
INR: 1 (ref 0.8–1.2)
Prothrombin Time: 12.8 seconds (ref 11.4–15.2)

## 2021-05-28 LAB — BRAIN NATRIURETIC PEPTIDE: B Natriuretic Peptide: 6.9 pg/mL (ref 0.0–100.0)

## 2021-05-28 LAB — BASIC METABOLIC PANEL
Anion gap: 7 (ref 5–15)
BUN: 15 mg/dL (ref 6–20)
CO2: 25 mmol/L (ref 22–32)
Calcium: 9.1 mg/dL (ref 8.9–10.3)
Chloride: 108 mmol/L (ref 98–111)
Creatinine, Ser: 0.98 mg/dL (ref 0.61–1.24)
GFR, Estimated: >60 mL/min (ref >60)
Glucose, Bld: 96 mg/dL (ref 70–99)
Potassium: 3.9 mmol/L (ref 3.5–5.1)
Sodium: 140 mmol/L (ref 135–145)

## 2021-05-28 LAB — CBC
HCT: 48.4 % (ref 39.0–52.0)
Hemoglobin: 16.1 g/dL (ref 13.0–17.0)
MCH: 29.3 pg (ref 26.0–34.0)
MCHC: 33.3 g/dL (ref 30.0–36.0)
MCV: 88 fL (ref 80.0–100.0)
Platelets: 229 10*3/uL (ref 150–400)
RBC: 5.5 MIL/uL (ref 4.22–5.81)
RDW: 12.8 % (ref 11.5–15.5)
WBC: 7.1 10*3/uL (ref 4.0–10.5)
nRBC: 0 % (ref 0.0–0.2)

## 2021-05-28 LAB — TROPONIN I (HIGH SENSITIVITY)
Troponin I (High Sensitivity): 6 ng/L (ref <18)
Troponin I (High Sensitivity): 5 ng/L (ref <18)

## 2021-05-28 IMAGING — CR DG CHEST 2V
1 series · 2 of 2 positions shown · non-contrast
Comparison: None.

CLINICAL DATA: Chest pain

EXAM:
CHEST - 2 VIEW

[Series 1: dg chest 2 view · 0.14mm/px · 2 of 2 slices shown]
[im 1/2]
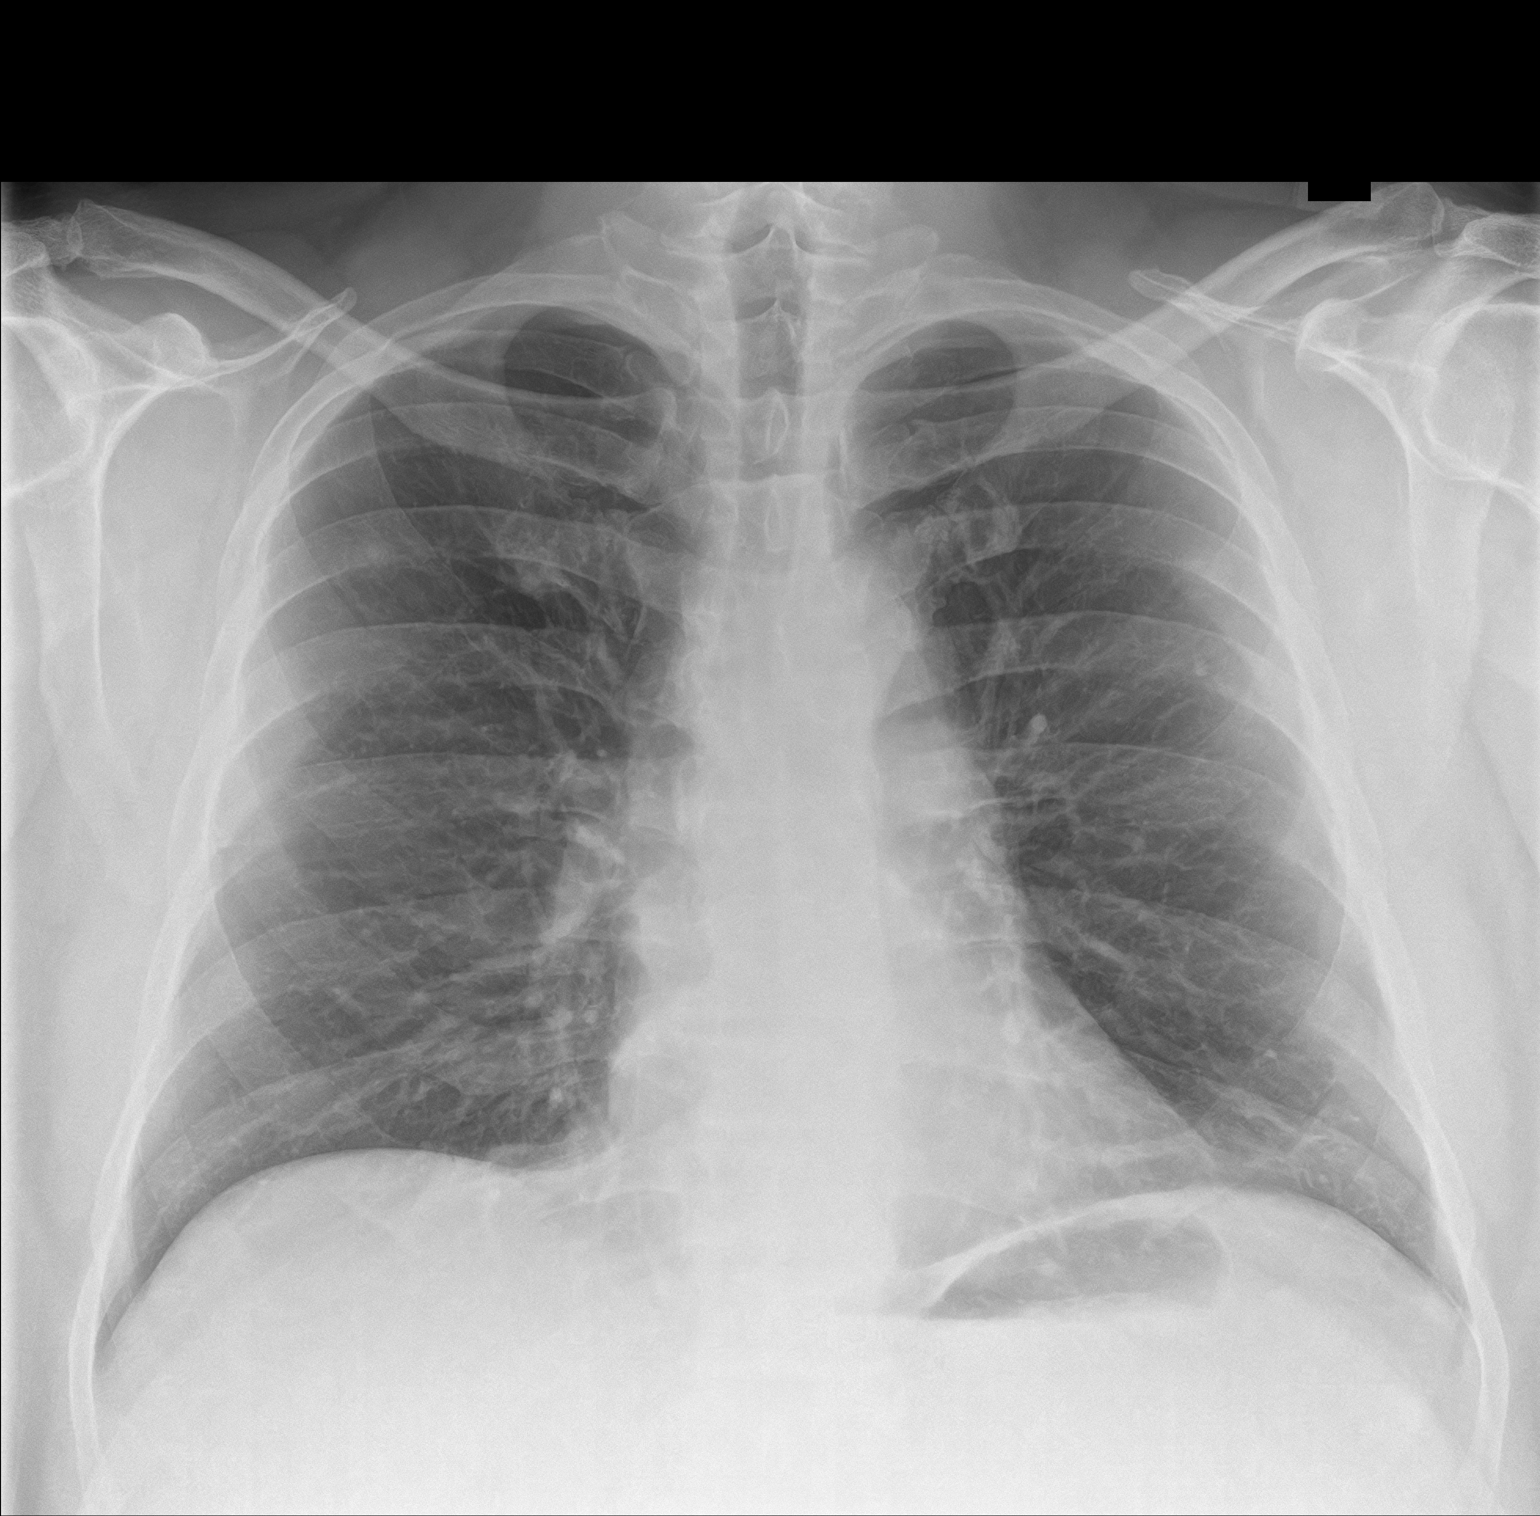
[im 2/2]
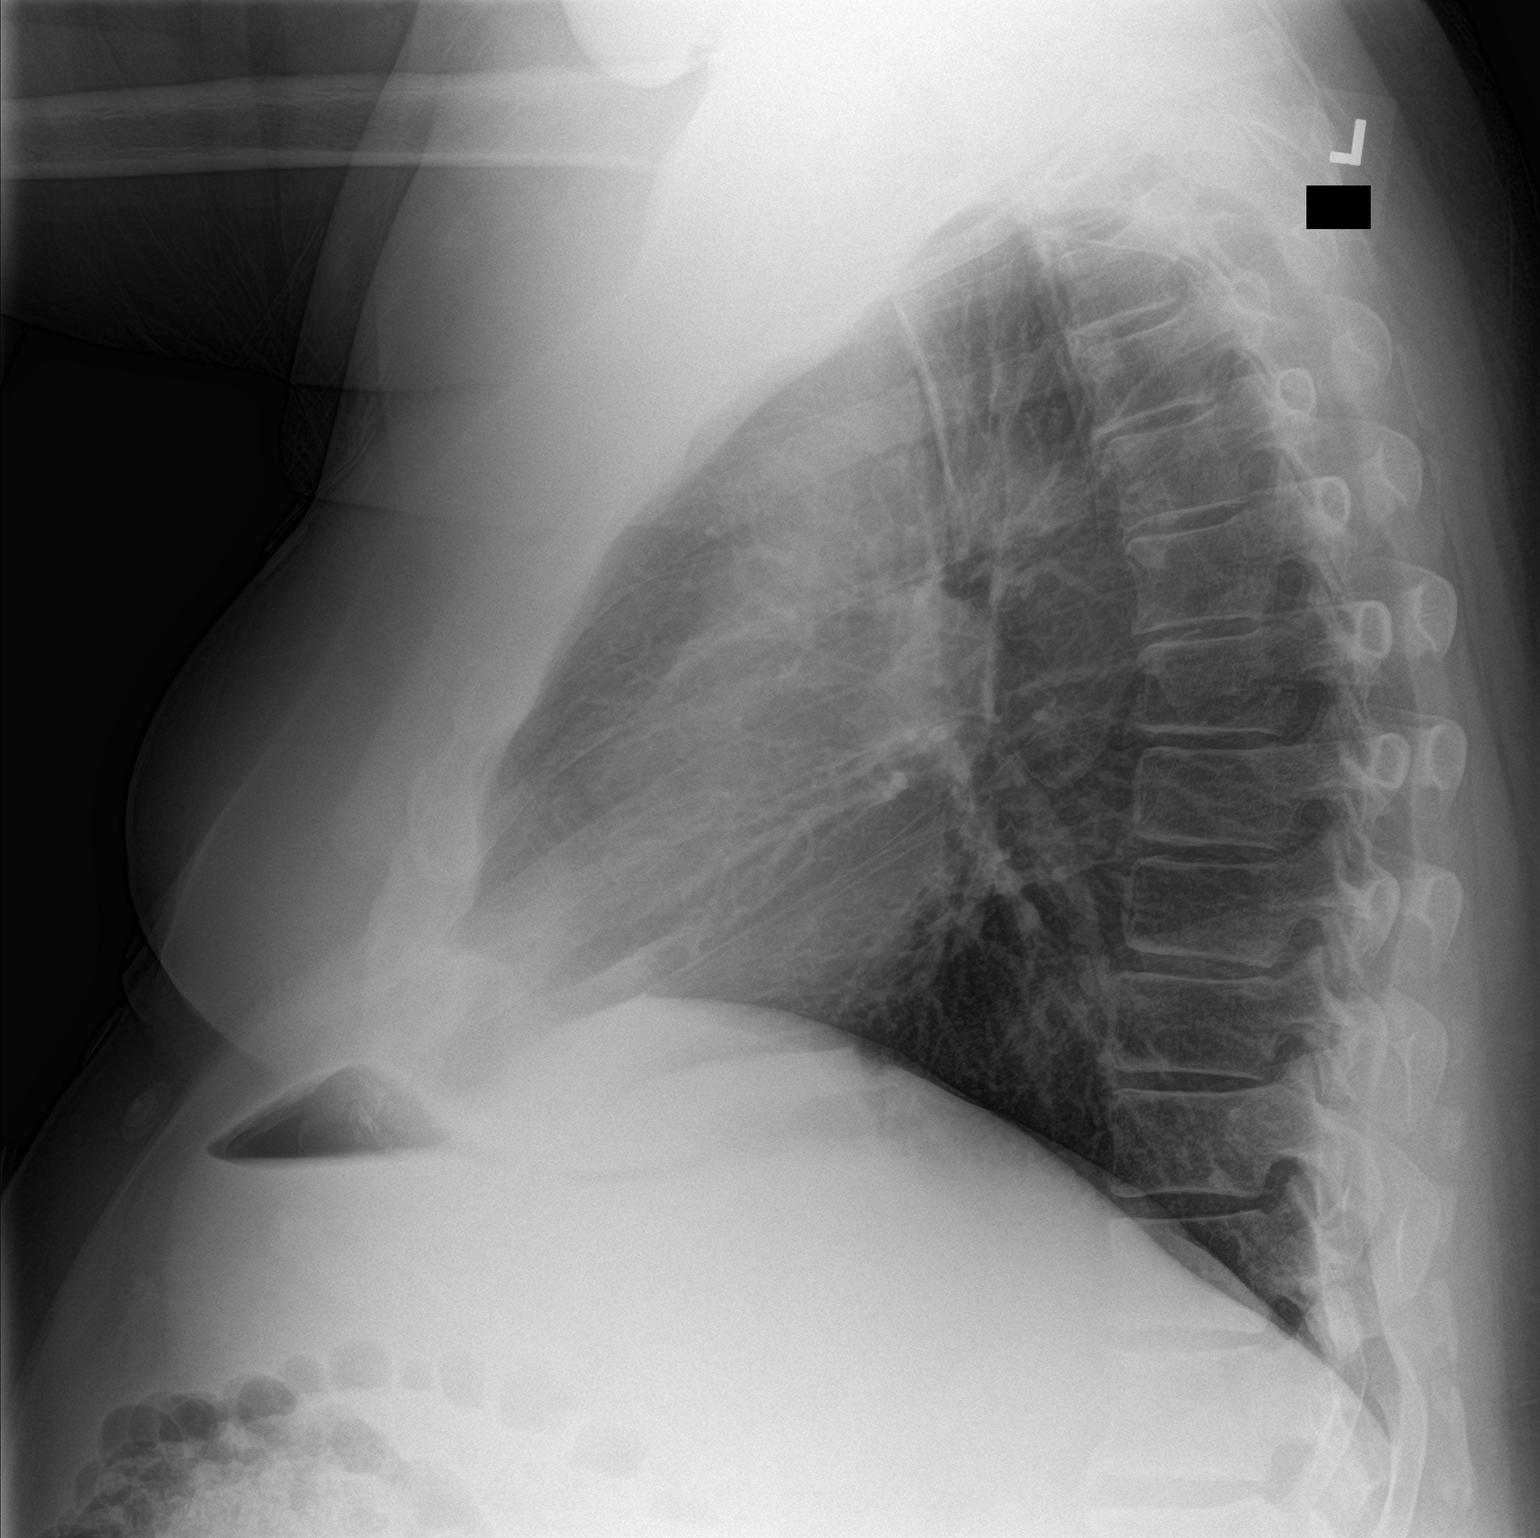

[2 of 2 positions shown; findings below may reference images not displayed]

FINDINGS: Cardiac and mediastinal contours are within normal limits. No focal
pulmonary opacity. No pleural effusion or pneumothorax. No acute
osseous abnormality.
IMPRESSION: No acute cardiopulmonary process.

## 2021-05-28 MED ORDER — ACETAMINOPHEN 500 MG PO TABS: 1000.0000 mg | ORAL_TABLET | ORAL | Status: DC

## 2021-05-28 MED ORDER — IOHEXOL 350 MG/ML SOLN
100.0000 mL | Freq: Once | INTRAVENOUS | Status: AC | PRN
  Administered 2021-05-28: 04:00:00 100 mL via INTRAVENOUS

## 2021-05-28 MED ORDER — ASPIRIN 81 MG PO CHEW
324.0000 mg | CHEWABLE_TABLET | Freq: Once | ORAL | Status: AC
Start: 2021-05-28 — End: 2021-05-28
  Administered 2021-05-28: 04:00:00 324 mg via ORAL
  Filled 2021-05-28: qty 4

## 2021-05-28 NOTE — ED Triage Notes (Signed)
Pt to ED from home c/o CP and SOB.  States went to bed fine and got woken up with mid chest pain and feeling like he can't catch his breath. States hx of arrhythmia and on blood thinner but denies MI hx.  Denies cough, but nausea without vomiting.  Pt tearful in triage, states long sentences are hard, chest rise even and unlabored, skin WNL.

## 2021-05-28 NOTE — ED Provider Notes (Signed)
[facility] Provider Note    Event Date/Time   First MD Initiated Contact with Patient 05/28/21 0310     (approximate)   History   Chest Pain and Shortness of Breath   HPI  Jerry Olsen is a 56 y.o. male history of paroxysmal A. fib as well as DVT currently anticoagulated  Patient reports compliance with his anticoagulation regimen.  Has been in his normal state of health, until tonight he woke up rather abruptly with a feeling of shortness of breath as though he could not catch his breath.  After arriving to the ER and about the time he got his chest x-ray the symptoms resolved completely.  He feels much better now.  He does have pain in his lower back but that is chronic in nature and not new.  Not currently having chest pain or shortness of breath.  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  Chronic swelling in his right forearm no change, been the same for years  He is recently retired, has been working outside recently.  Generally exerts himself and feels out of shape but does not experience chest pain or difficulty breathing  No recent illness no fevers no wheezing.  Symptoms are improved now, her symptoms resolved on their own around the time he got his chest x-ray here at the hospital HPI: A 56 year old patient with a history of peripheral artery disease, hypertension, hypercholesterolemia and obesity presents for evaluation of chest pain. Initial onset of pain was approximately 1-3 hours ago. The patient's chest pain is described as heaviness/pressure/tightness and is not worse with exertion. The patient complains of nausea. The patient's chest pain is middle- or left-sided, is not well-localized, is not sharp and does not radiate to the arms/jaw/neck. The patient denies diaphoresis. The patient has no history of stroke, has not smoked in the past 90 days, denies any history of treated diabetes and has no relevant family history of coronary artery  disease (first degree relative at less than age 43).  Denies weight gain or leg swelling.  No history of congestive heart failure.  Had a slight cough seems to be better now  Patient denies any known cardiac history.  Cardiac risk factors include hyperlipidemia and hypertension  Physical Exam   Triage Vital Signs: ED Triage Vitals  Enc Vitals Group     BP 05/28/21 0227 (!) 130/106     Pulse Rate 05/28/21 0227 91     Resp 05/28/21 0227 20     Temp 05/28/21 0227 98.6 F (37 C)     Temp Source 05/28/21 0227 Oral     SpO2 05/28/21 0227 96 %     Weight 05/28/21 0227 285 lb (129.3 kg)     Height 05/28/21 0227 6\' 2"  (1.88 m)     Head Circumference --      Peak Flow --      Pain Score 05/28/21 0228 5     Pain Loc --      Pain Edu? --      Excl. in [place]? --     Most recent vital signs: Vitals:   05/28/21 0415 05/28/21 0518  BP: 130/88 128/80  Pulse: 77 70  Resp: 18 18  Temp:    SpO2: 99% 98%     General: Awake, no distress.  Conversant and pleasant.  Of note he does report his symptoms are better now but did feel significantly short of breath and was having hard to describe  chest discomfort prior to coming in CV:  Good peripheral perfusion.  Normal rate and rhythm. Resp:  Normal effort.  Clear lung sounds.  No murmurs rubs.  No wheezing or crackles.  Normal speech and normal respiratory pattern at this time Abd:  No distention.  Other:  No noted jugular venous distention No evidence of edema in the lower extremities bilaterally.  No unilateral leg swelling.  Right forearm is slightly swollen compared to the left, but patient reports this to be quite chronic in nature and unchanged with known AV malformation in this region   ED Results / Procedures / Treatments   Labs (all labs ordered are listed, but only abnormal results are displayed) Labs Reviewed  BASIC METABOLIC PANEL  CBC  PROTIME-INR  BRAIN NATRIURETIC PEPTIDE  TROPONIN I (HIGH SENSITIVITY)  TROPONIN I (HIGH  SENSITIVITY)     EKG  EKG is reviewed inter by me at 225 Heart rate 109 QRS 99 QTc 480 Normal sinus rhythm no evidence of acute ischemia, borderline tachycardia   RADIOLOGY   CT angiography reviewed, negative for acute chest finding  PROCEDURES:  Critical Care performed: No  Procedures   MEDICATIONS ORDERED IN ED: Medications  acetaminophen (TYLENOL) tablet 1,000 mg (0 mg Oral Hold 05/28/21 0358)  aspirin chewable tablet 324 mg (324 mg Oral Given 05/28/21 0356)  iohexol (OMNIPAQUE) 350 MG/ML injection 100 mL (100 mLs Intravenous Contrast Given 05/28/21 0422)     IMPRESSION / MDM / [place] / ED COURSE  I reviewed the triage vital signs and the nursing notes. HEAR Score: 4         HEAR Score: 4                  Reviewed patient's recent primary care visit including visit with Dr. Ramonita Lab on 04/09/2021, history of DVT paroxysmal A. Fib on eliquis  Differential diagnosis includes, but is not limited to, ACS, aortic dissection, pulmonary embolism, cardiac tamponade, pneumothorax, pneumonia, pericarditis, myocarditis, GI-related causes including esophagitis/gastritis, and musculoskeletal chest wall pain.    No associated abdominal pain.  No pain on abdominal exam.  Negative Murphy.  No pain in epigastrium or upper abdomen bilateral.  Reviewed the patient's previous note with vascular surgery from November of 14th of last year.  Known AV malformation history of paroxysmal A. fib on anticoagulation  HEAR Score: 4   Patient is on cardiac monitor which I reviewed for evidence of arrhythmias.  No noted arrhythmias.  There was 1 episode of apparent artifact, but careful inspection demonstrates artifact from limb movement  No associated palpitations.    Clinical Course as of 05/28/21 0523  Wed May 28, 2021  0506 Reviewed the patient's CT angiogram of the chest.  I do not see evidence of large infiltrative process, pericardial effusion, or saddle embolism. [MQ]     Clinical Course User Index [MQ] Delman Kitten, MD    Review of telemetry demonstrates normal sinus rhythm.  Occasional artifact, but QRS complexes are noted to march throughout.  Do not see evidence of acute arrhythmia   Reassuring that the patient is currently asymptomatic.  Reassuring and normal vital signs at this time.  Etiology of his dyspnea is not clear.  Although thus far is work-up reassuring.  Currently no evidence for ACS, but given his history of known DVT thankfully anticoagulated, we will proceed with CT angiography to exclude pulmonary embolism evaluate for other acute cardiopulmonary abnormality  Given his asymptomatic status no known history of coronary  disease, will treat with salicylate here and also proceed down heart pathway   ----------------------------------------- 5:24 AM on 05/28/2021 ----------------------------------------- Normal troponin x2.  Asymptomatic resting comfortably at this time normal vital signs fully awake and alert.  Both he and his wife comfortable with plan for discharge and follow-up with Dr. Nehemiah Massed his cardiologist this week  Return precautions and treatment recommendations and follow-up discussed with the patient who is agreeable with the plan.   I sent a in basket message to Dr. Nehemiah Massed requesting that the cardiology appointment call patient to schedule follow-up this week as well  FINAL CLINICAL IMPRESSION(S) / ED DIAGNOSES   Final diagnoses:  Chest pain with low risk for cardiac etiology     Rx / DC Orders   ED Discharge Orders     None        Note:  This document was prepared using Dragon voice recognition software and may include unintentional dictation errors.   Delman Kitten, MD 05/28/21 [phone]

## 2021-05-28 NOTE — ED Notes (Signed)
Pt to CT

## 2021-05-28 NOTE — ED Notes (Signed)
E-signature pad unavailable - Pt verbalized understanding of D/C information - no additional concerns at this time.  

## 2021-05-28 NOTE — Discharge Instructions (Addendum)

## 2021-06-12 ENCOUNTER — Ambulatory Visit: Payer: Federal, State, Local not specified - PPO | Admitting: Dermatology

## 2021-06-18 ENCOUNTER — Other Ambulatory Visit: Payer: Self-pay | Admitting: Internal Medicine

## 2021-06-18 ENCOUNTER — Other Ambulatory Visit (HOSPITAL_COMMUNITY): Payer: Self-pay | Admitting: Internal Medicine

## 2021-06-18 DIAGNOSIS — M549 Dorsalgia, unspecified: Secondary | ICD-10-CM

## 2021-06-18 DIAGNOSIS — M545 Low back pain, unspecified: Secondary | ICD-10-CM

## 2021-06-30 ENCOUNTER — Ambulatory Visit
Admission: RE | Admit: 2021-06-30 | Discharge: 2021-06-30 | Disposition: A | Discharge status: Home / Self Care | Payer: Federal, State, Local not specified - PPO | Source: Ambulatory Visit | Attending: Internal Medicine | Admitting: Internal Medicine

## 2021-06-30 DIAGNOSIS — M545 Low back pain, unspecified: Secondary | ICD-10-CM | POA: Insufficient documentation

## 2021-06-30 DIAGNOSIS — M549 Dorsalgia, unspecified: Secondary | ICD-10-CM | POA: Insufficient documentation

## 2021-06-30 IMAGING — MR MR LUMBAR SPINE W/O CM
4 of 5 series · 30 of 48 positions shown · non-contrast
Comparison: None.

CLINICAL DATA: Chronic mid low back pain. Lower extremity weakness.

EXAM:
MRI THORACIC AND LUMBAR SPINE WITHOUT CONTRAST
TECHNIQUE: Multiplanar and multiecho pulse sequences of the thoracic and lumbar
spine were obtained without intravenous contrast.

[Series 16: T2 · sagittal · 4.0mm · 0.81mm/px · 7 of 17 slices shown (1 of 2)]
[im 1/17]
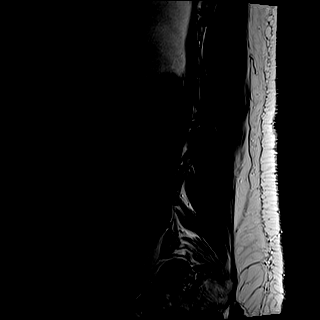
[im 3/17]
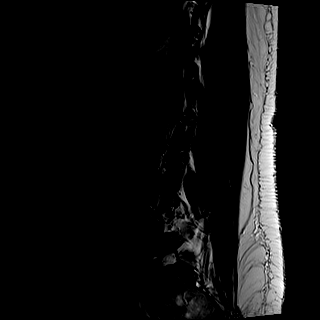
[im 6/17]
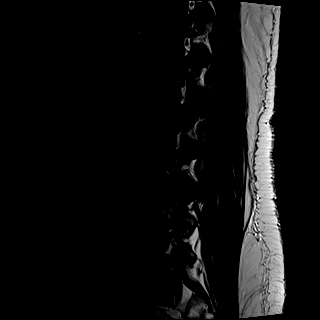
[im 9/17]
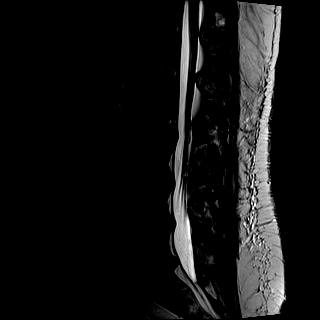
[im 11/17]
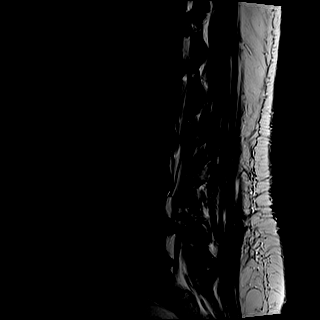
[im 14/17]
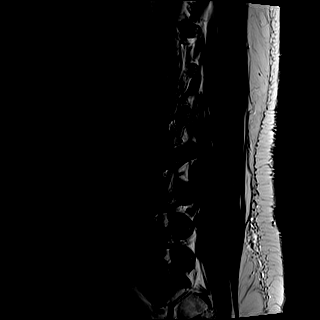
[im 17/17]
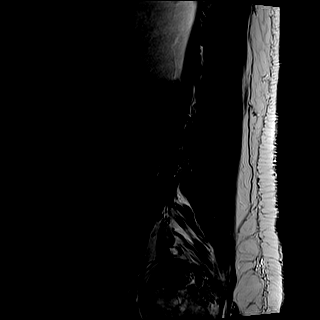

[Series 17: T1 · sagittal · 4.0mm · 0.81mm/px · 7 of 17 slices shown (1 of 2)]
[im 1/17]
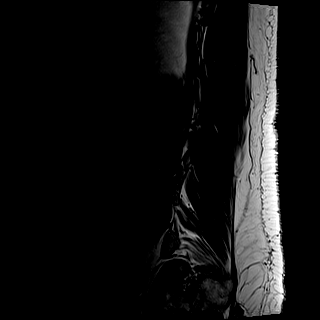
[im 3/17]
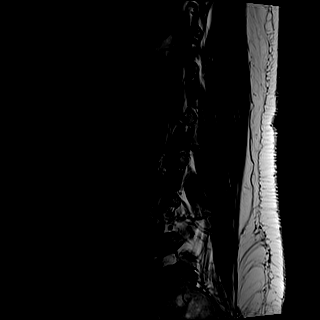
[im 6/17]
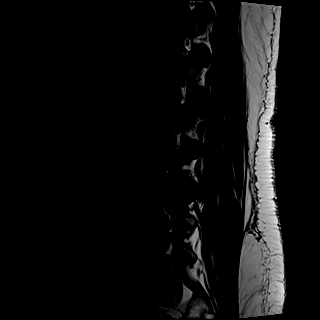
[im 9/17]
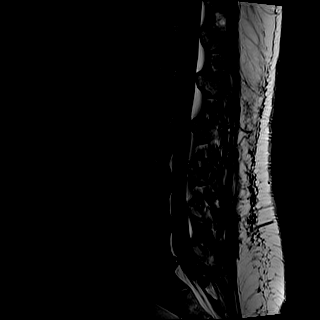
[im 11/17]
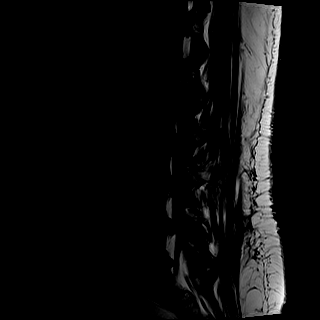
[im 14/17]
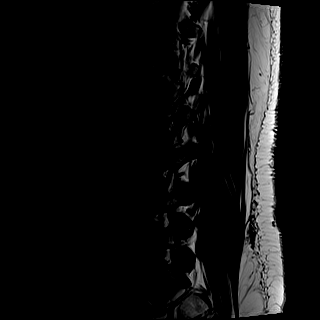
[im 17/17]
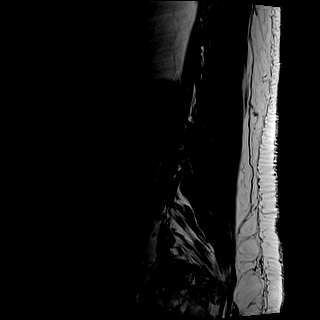

[Series 19: T2 · axial · 4.0mm · 0.78mm/px · z∈[-623,-404]mm · 8 of 38 slices shown (2 of 2)]
[im 1/38]
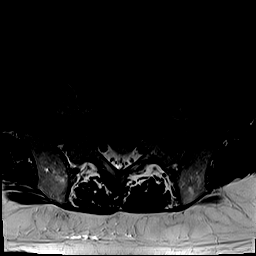
[im 6/38]
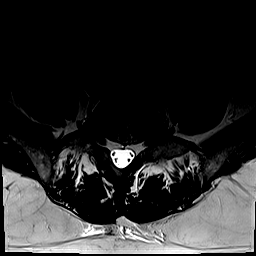
[im 12/38]
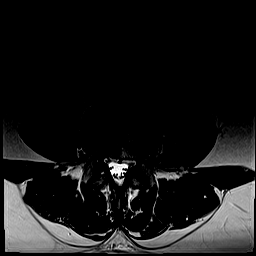
[im 18/38]
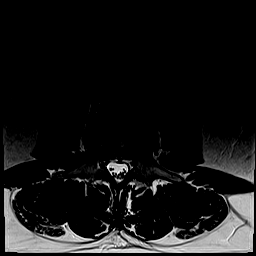
[im 20/38]
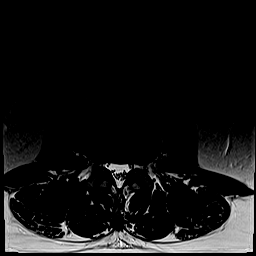
[im 26/38]
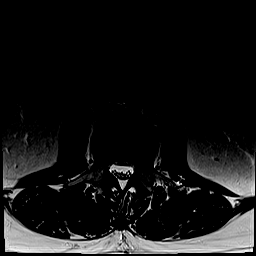
[im 32/38]
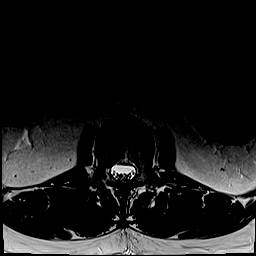
[im 38/38]
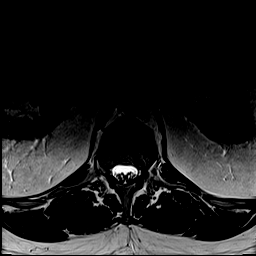

[Series 20: T1 · axial · 4.0mm · 0.39mm/px · z∈[-623,-404]mm · 8 of 38 slices shown (2 of 2)]
[im 1/38]
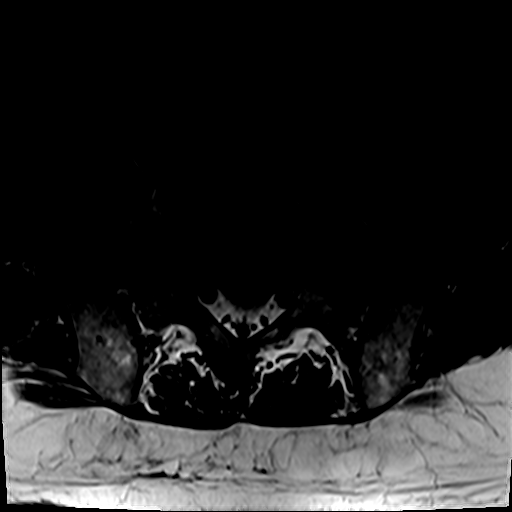
[im 6/38]
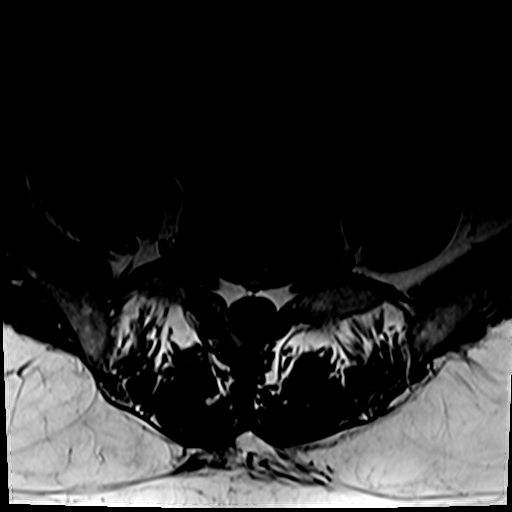
[im 12/38]
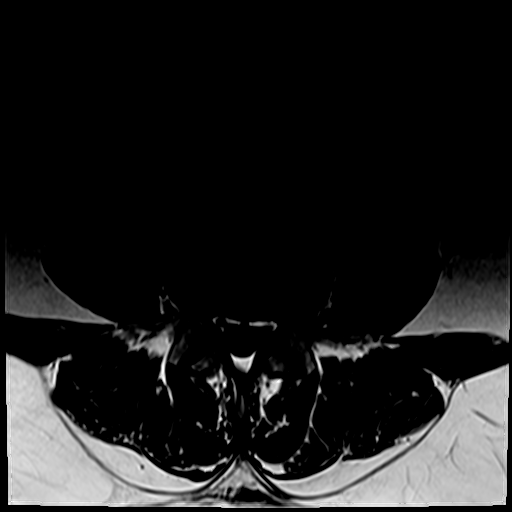
[im 18/38]
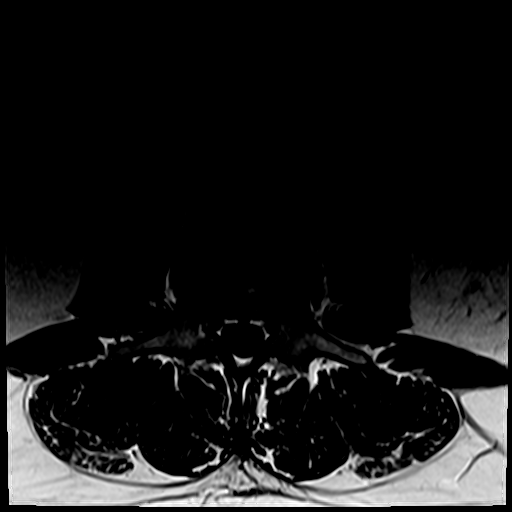
[im 20/38]
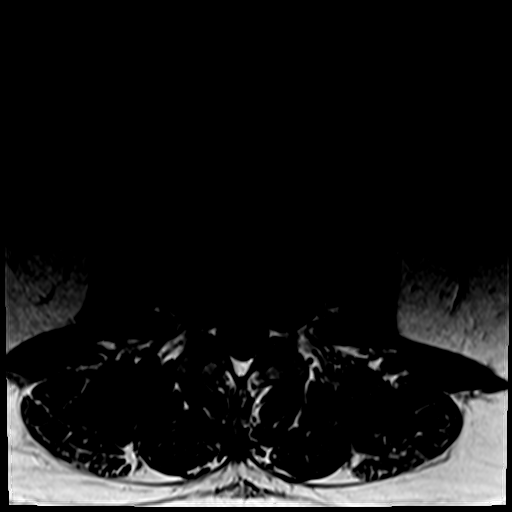
[im 26/38]
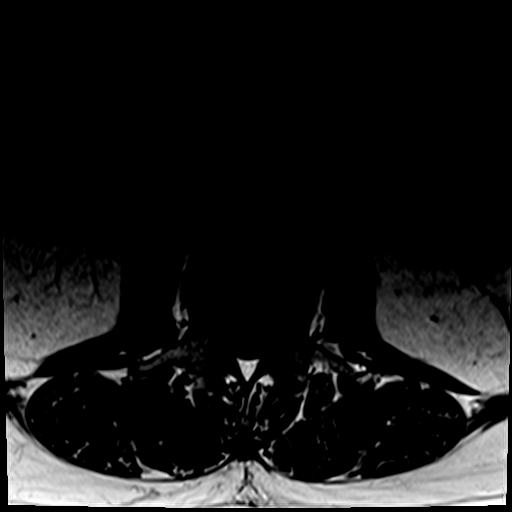
[im 32/38]
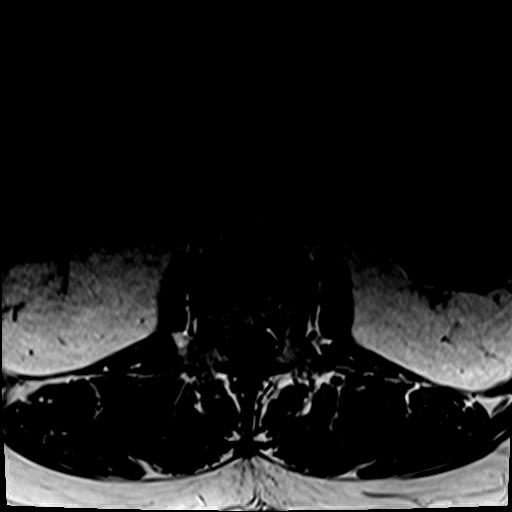
[im 38/38]
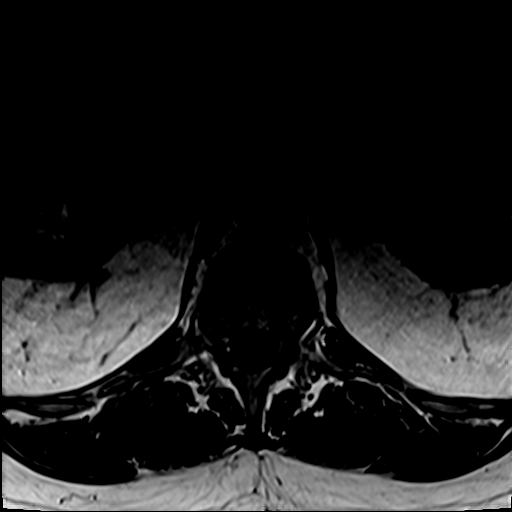

[30 of 48 positions shown; findings below may reference images not displayed]

FINDINGS: MRI THORACIC SPINE FINDINGS

Alignment:  Physiologic.

Vertebrae: No acute fracture, evidence of discitis, or aggressive
bone lesion.

Cord:  Normal signal and morphology.

Paraspinal and other soft tissues: No acute paraspinal abnormality.

Disc levels:

Disc spaces: Mild disc desiccation throughout the thoracic spine.
Thoracic disc heights are relatively well maintained.

T1-T2: No disc protrusion, foraminal stenosis or central canal
stenosis.

T2-T3: No disc protrusion, foraminal stenosis or central canal
stenosis.

T3-T4: No disc protrusion, foraminal stenosis or central canal
stenosis.

T4-T5: No disc protrusion, foraminal stenosis or central canal
stenosis.

T5-T6: No disc protrusion, foraminal stenosis or central canal
stenosis.

T6-T7: No disc protrusion, foraminal stenosis or central canal
stenosis.

T7-T8: Small right paracentral disc protrusion at T7-8. No foraminal
or central canal stenosis.

T8-T9: No disc protrusion, foraminal stenosis or central canal
stenosis.

T9-T10: No disc protrusion, foraminal stenosis or central canal
stenosis.

T10-T11: No disc protrusion, foraminal stenosis or central canal
stenosis.

T11-T12: No disc protrusion, foraminal stenosis or central canal
stenosis.

MRI LUMBAR SPINE FINDINGS

Segmentation:  Standard.

Alignment:  Physiologic.

Vertebrae: No acute fracture, evidence of discitis, or aggressive
bone lesion.

Conus medullaris and cauda equina: Conus extends to the T12 level.
Conus and cauda equina appear normal.

Paraspinal and other soft tissues: No acute paraspinal abnormality.

Disc levels:

Disc spaces: Disc desiccation at L2-3, L3-4, L4-5 and L5-S1. Disc
height loss at L4-5 and L5-S1.

T12-L1: No significant disc bulge. No neural foraminal stenosis. No
central canal stenosis.

L1-L2: No significant disc bulge. No neural foraminal stenosis. No
central canal stenosis.

L2-L3: Mild broad-based disc bulge. No foraminal or central canal
stenosis.

L3-L4: Mild broad-based disc bulge eccentric towards the left.
Moderate left foraminal stenosis. No right foraminal stenosis. No
spinal stenosis. Left subarticular recess stenosis.

L4-L5: Mild broad-based disc bulge with a small right paracentral
disc protrusion contacting the right intraspinal L5 nerve root.
Right subarticular recess stenosis. Mild right foraminal stenosis.
No left foraminal stenosis. Mild bilateral facet arthropathy. Right
laminotomy defect.

L5-S1: Prior right laminotomy defect. Mild bilateral facet
arthropathy. Mild broad-based disc bulge eccentric towards the left.
Small left paracentral disc protrusion contacting the left
intraspinal S1 nerve root. No significant foraminal or central canal
stenosis.
IMPRESSION: MR THORACIC SPINE IMPRESSION:

1. No acute osseous injury of the thoracic spine.
2. At T7-8 there is a small right paracentral disc protrusion. No
foraminal or central canal stenosis.

MR LUMBAR SPINE IMPRESSION:

1. No acute osseous injury of the lumbar spine.
2. At L4-5 there is a mild broad-based disc bulge with a small right
paracentral disc protrusion contacting the right intraspinal L5
nerve root. Right subarticular recess stenosis. Mild right foraminal
stenosis. Mild bilateral facet arthropathy. Right laminotomy defect.
3. At L5-S1 there is a right laminotomy defect. Mild broad-based
disc bulge eccentric towards the left. Small left paracentral disc
protrusion contacting the left intraspinal S1 nerve root. No
significant foraminal or central canal stenosis.

## 2021-06-30 IMAGING — MR MR THORACIC SPINE W/O CM
7 of 8 series · 33 of 48 positions shown · non-contrast
Comparison: None.

CLINICAL DATA: Chronic mid low back pain. Lower extremity weakness.

EXAM:
MRI THORACIC AND LUMBAR SPINE WITHOUT CONTRAST
TECHNIQUE: Multiplanar and multiecho pulse sequences of the thoracic and lumbar
spine were obtained without intravenous contrast.

[Series 16: T1 · sagittal · 5.0mm · 1.88mm/px · 3 of 9 slices shown (1 of 4)]
[im 1/9]
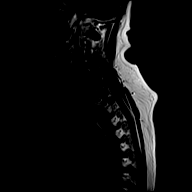
[im 5/9]
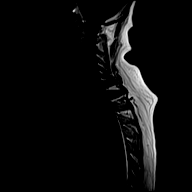
[im 9/9]
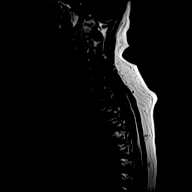

[Series 17: T1 · sagittal · 5.0mm · 1.88mm/px · 3 of 9 slices shown (2 of 4)]
[im 1/9]
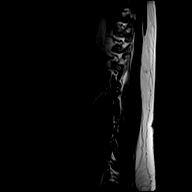
[im 5/9]
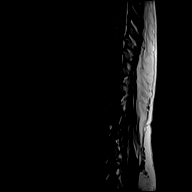
[im 9/9]
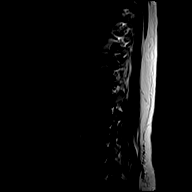

[Series 18: T1 · sagittal · 6.0mm · 1.88mm/px · 3 of 9 slices shown (3 of 4)]
[im 1/9]
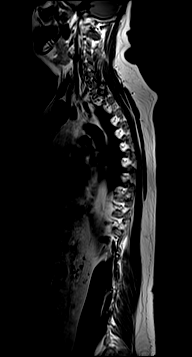
[im 5/9]
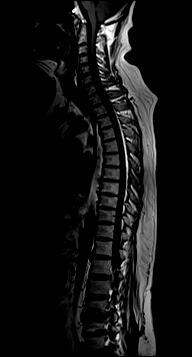
[im 9/9]
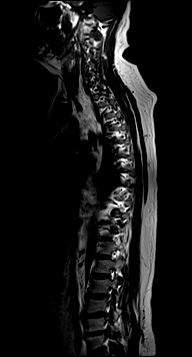

[Series 19: T2 · sagittal · 3.0mm · 1.06mm/px · 5 of 17 slices shown (1 of 2)]
[im 1/17]
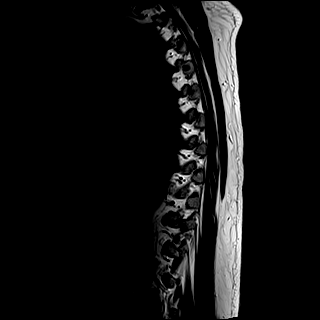
[im 5/17]
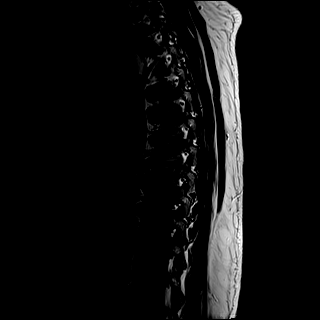
[im 9/17]
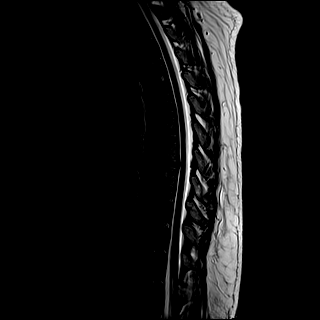
[im 13/17]
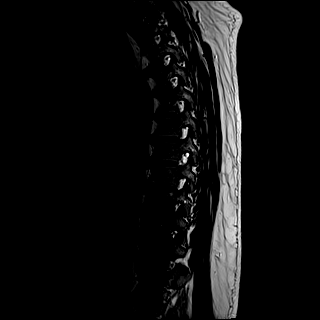
[im 17/17]
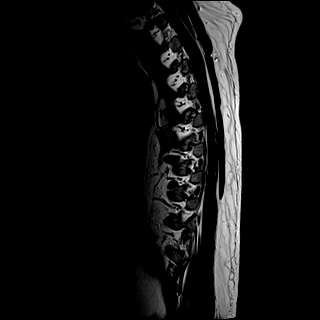

[Series 20: T1 · sagittal · 3.0mm · 1.06mm/px · 5 of 17 slices shown (4 of 4)]
[im 1/17]
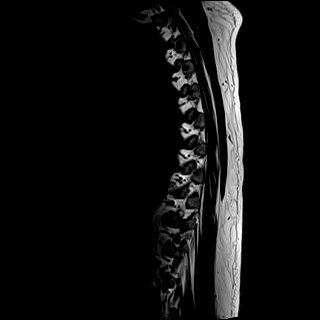
[im 5/17]
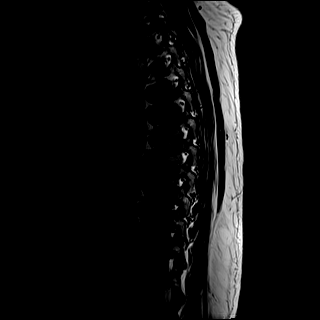
[im 9/17]
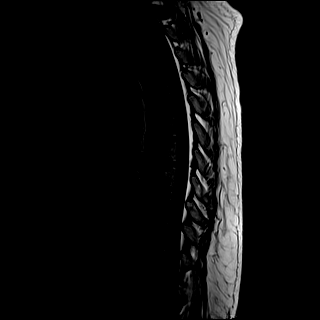
[im 13/17]
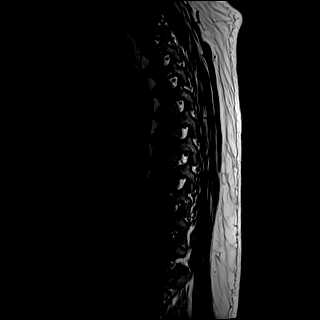
[im 17/17]
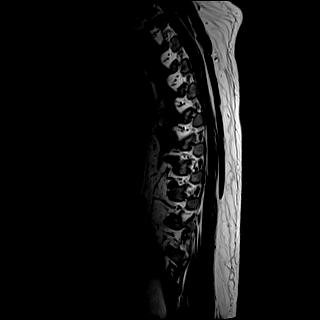

[Series 21: STIR · sagittal · 3.0mm · 0.53mm/px · 5 of 17 slices shown]
[im 1/17]
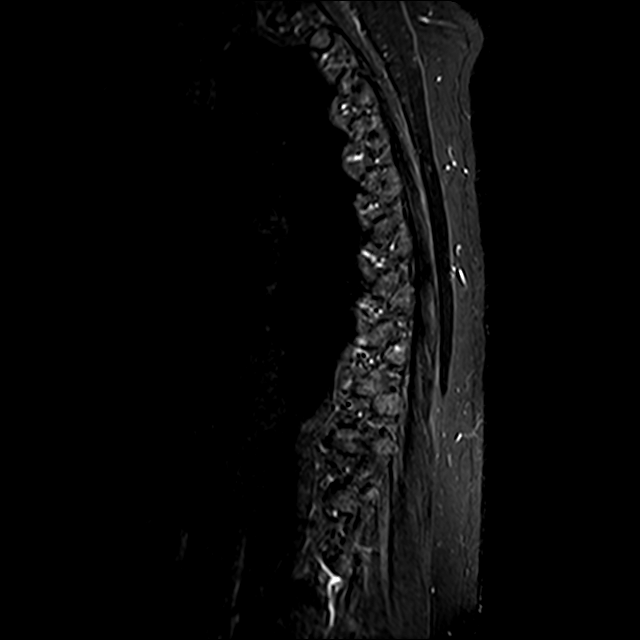
[im 5/17]
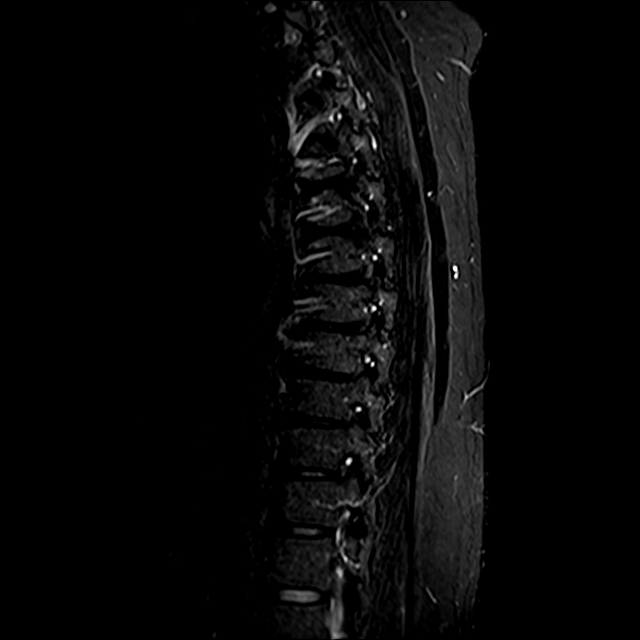
[im 9/17]
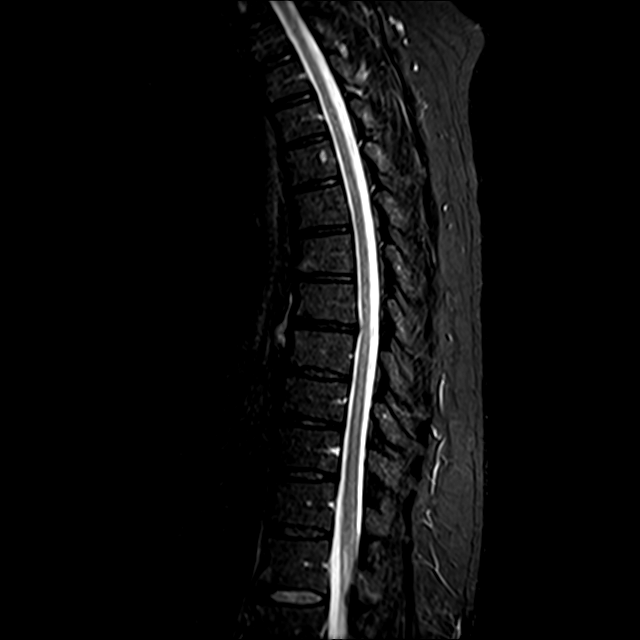
[im 13/17]
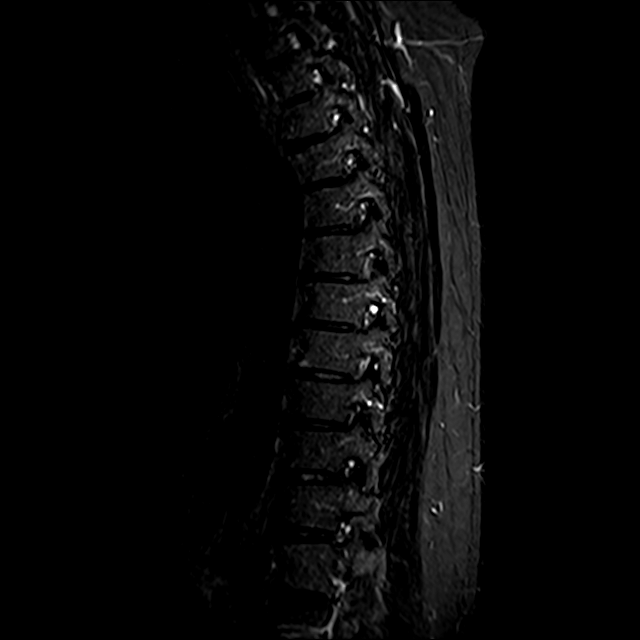
[im 17/17]
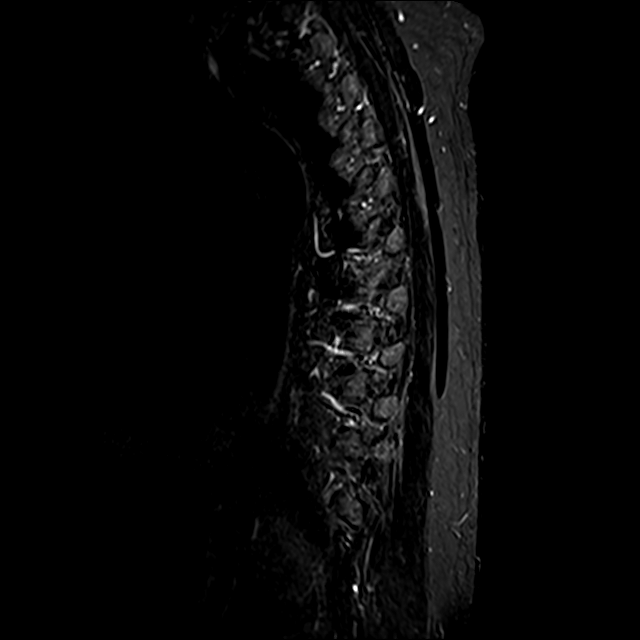

[Series 22: T2 · axial · 4.0mm · 0.59mm/px · z∈[-415,-154]mm · 9 of 39 slices shown (2 of 2)]
[im 1/39]
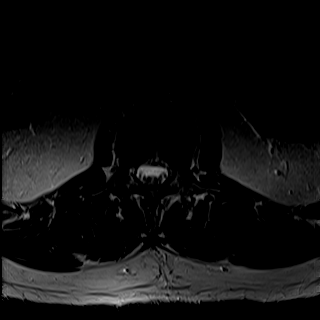
[im 7/39]
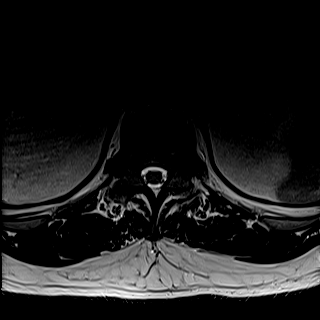
[im 11/39]
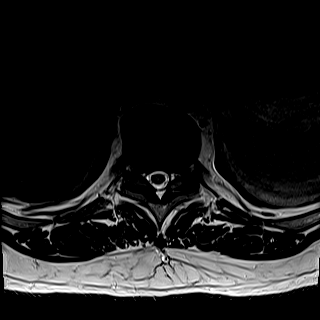
[im 18/39]
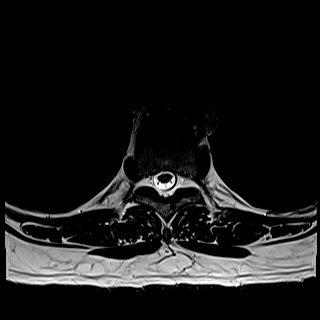
[im 21/39]
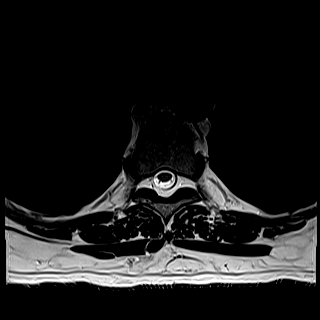
[im 28/39]
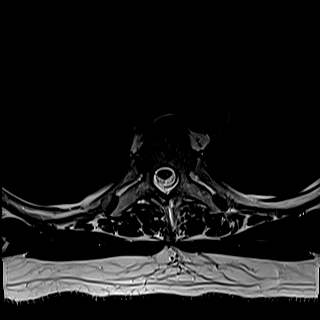
[im 32/39]
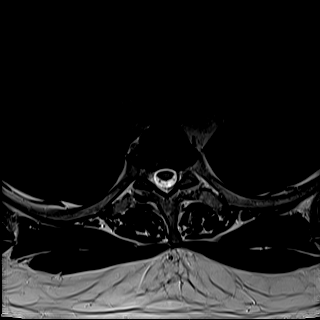
[im 35/39]
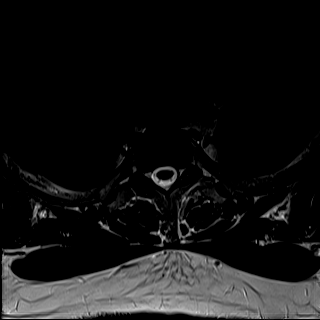
[im 39/39]
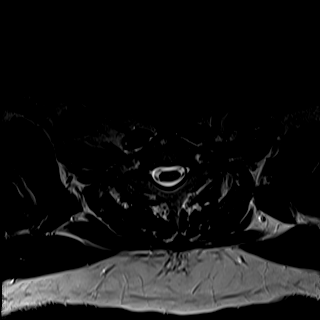

[33 of 48 positions shown; findings below may reference images not displayed]

FINDINGS: MRI THORACIC SPINE FINDINGS

Alignment:  Physiologic.

Vertebrae: No acute fracture, evidence of discitis, or aggressive
bone lesion.

Cord:  Normal signal and morphology.

Paraspinal and other soft tissues: No acute paraspinal abnormality.

Disc levels:

Disc spaces: Mild disc desiccation throughout the thoracic spine.
Thoracic disc heights are relatively well maintained.

T1-T2: No disc protrusion, foraminal stenosis or central canal
stenosis.

T2-T3: No disc protrusion, foraminal stenosis or central canal
stenosis.

T3-T4: No disc protrusion, foraminal stenosis or central canal
stenosis.

T4-T5: No disc protrusion, foraminal stenosis or central canal
stenosis.

T5-T6: No disc protrusion, foraminal stenosis or central canal
stenosis.

T6-T7: No disc protrusion, foraminal stenosis or central canal
stenosis.

T7-T8: Small right paracentral disc protrusion at T7-8. No foraminal
or central canal stenosis.

T8-T9: No disc protrusion, foraminal stenosis or central canal
stenosis.

T9-T10: No disc protrusion, foraminal stenosis or central canal
stenosis.

T10-T11: No disc protrusion, foraminal stenosis or central canal
stenosis.

T11-T12: No disc protrusion, foraminal stenosis or central canal
stenosis.

MRI LUMBAR SPINE FINDINGS

Segmentation:  Standard.

Alignment:  Physiologic.

Vertebrae: No acute fracture, evidence of discitis, or aggressive
bone lesion.

Conus medullaris and cauda equina: Conus extends to the T12 level.
Conus and cauda equina appear normal.

Paraspinal and other soft tissues: No acute paraspinal abnormality.

Disc levels:

Disc spaces: Disc desiccation at L2-3, L3-4, L4-5 and L5-S1. Disc
height loss at L4-5 and L5-S1.

T12-L1: No significant disc bulge. No neural foraminal stenosis. No
central canal stenosis.

L1-L2: No significant disc bulge. No neural foraminal stenosis. No
central canal stenosis.

L2-L3: Mild broad-based disc bulge. No foraminal or central canal
stenosis.

L3-L4: Mild broad-based disc bulge eccentric towards the left.
Moderate left foraminal stenosis. No right foraminal stenosis. No
spinal stenosis. Left subarticular recess stenosis.

L4-L5: Mild broad-based disc bulge with a small right paracentral
disc protrusion contacting the right intraspinal L5 nerve root.
Right subarticular recess stenosis. Mild right foraminal stenosis.
No left foraminal stenosis. Mild bilateral facet arthropathy. Right
laminotomy defect.

L5-S1: Prior right laminotomy defect. Mild bilateral facet
arthropathy. Mild broad-based disc bulge eccentric towards the left.
Small left paracentral disc protrusion contacting the left
intraspinal S1 nerve root. No significant foraminal or central canal
stenosis.
IMPRESSION: MR THORACIC SPINE IMPRESSION:

1. No acute osseous injury of the thoracic spine.
2. At T7-8 there is a small right paracentral disc protrusion. No
foraminal or central canal stenosis.

MR LUMBAR SPINE IMPRESSION:

1. No acute osseous injury of the lumbar spine.
2. At L4-5 there is a mild broad-based disc bulge with a small right
paracentral disc protrusion contacting the right intraspinal L5
nerve root. Right subarticular recess stenosis. Mild right foraminal
stenosis. Mild bilateral facet arthropathy. Right laminotomy defect.
3. At L5-S1 there is a right laminotomy defect. Mild broad-based
disc bulge eccentric towards the left. Small left paracentral disc
protrusion contacting the left intraspinal S1 nerve root. No
significant foraminal or central canal stenosis.

## 2021-07-03 ENCOUNTER — Ambulatory Visit: Payer: Self-pay | Admitting: Dermatology

## 2022-03-02 ENCOUNTER — Encounter (INDEPENDENT_AMBULATORY_CARE_PROVIDER_SITE_OTHER): Payer: Self-pay

## 2022-03-30 DIAGNOSIS — D6869 Other thrombophilia: Secondary | ICD-10-CM | POA: Insufficient documentation

## 2022-05-14 ENCOUNTER — Encounter: Payer: Self-pay | Admitting: Dermatology

## 2022-05-14 ENCOUNTER — Ambulatory Visit: Payer: Federal, State, Local not specified - PPO | Admitting: Dermatology

## 2022-05-14 VITALS — BP 132/83 | HR 71

## 2022-05-14 DIAGNOSIS — Z7189 Other specified counseling: Secondary | ICD-10-CM

## 2022-05-14 DIAGNOSIS — L570 Actinic keratosis: Secondary | ICD-10-CM | POA: Diagnosis not present

## 2022-05-14 DIAGNOSIS — L578 Other skin changes due to chronic exposure to nonionizing radiation: Secondary | ICD-10-CM | POA: Diagnosis not present

## 2022-05-14 DIAGNOSIS — B078 Other viral warts: Secondary | ICD-10-CM | POA: Diagnosis not present

## 2022-05-14 NOTE — Patient Instructions (Addendum)
Cryotherapy Aftercare  Wash gently with soap and water everyday.   Apply Vaseline and Band-Aid daily until healed.     Instructions for After In-Office Application of Cantharidin  1. This is a strong medicine; please follow ALL instructions.  2. Gently wash off with soap and water in 1 hour or sooner s directed by your physician.  3. **WARNING** this medicine can cause severe blistering, blood blisters, infection, and/or scarring if it is not washed off as directed.  4. Your progress will be rechecked in 1-2 months; call sooner if there are any questions or problems.   Cantharidin Plus is a blistering agent that comes from a beetle.  It needs to be washed off in about 4 hours after application.  Although it is painless when applied in office, it may cause symptoms of mild pain and burning several hours later.  Treated areas will swell and turn red, and blisters may form.  Vaseline and a bandaid may be applied until wound has healed.  Once healed, the skin may remain temporarily discolored.  It can take weeks to months for pigmentation to return to normal.  Advised to wash off with soap and water in 4 hours or sooner if it becomes tender before then.   Viral Warts & Molluscum Contagiosum  Viral warts and molluscum contagiosum are growths of the skin caused by viral infection of the skin. If you have been given the diagnosis of viral warts or molluscum contagiosum there are a few things that you must understand about your condition:  There is no guaranteed treatment method available for this condition. Multiple treatments may be required, The treatments may be time consuming and require multiple visits to the dermatology office. The treatment may be expensive. You will be charged each time you come into the office to have the spots treated. The treated areas may develop new lesions further complicating treatment. The treated areas may leave a scar. There is no guarantee that even after  multiple treatments that the spots will be successfully treated. These are caused by a viral infection and can be spread to other areas of the skin and to other people by direct contact. Therefore, new spots may occur.  - Start 5-fluorouracil/calcipotriene cream twice a day for 7 days to affected areas including scalp. Prescription sent to [facility]. Patient advised they will receive an email to purchase the medication online and have it sent to their home. Patient provided with handout reviewing treatment course and side effects and advised to call or message [place] on MyChart with any concerns.  Reviewed course of treatment and expected reaction.  Patient advised to expect inflammation and crusting and advised that erosions are possible.  Patient advised to be diligent with sun protection during and after treatment. Counseled to keep medication out of reach of children and pets.   Instructions for [facility] Medications  One or more of your medications was sent to the [facility] mail order [facility] pharmacy. You will receive an email from them and can purchase the medicine through that link. It will then be mailed to your home at the address you confirmed. If for any reason you do not receive an email from them, please check your spam folder. If you still do not find the email, please let us know. [facility] phone number is [phone].   5-Fluorouracil/Calcipotriene Patient Education   Actinic keratoses are the dry, red scaly spots on the skin caused by sun damage. A portion of  these spots can turn into skin cancer with time, and treating them can help prevent development of skin cancer.   Treatment of these spots requires removal of the defective skin cells. There are various ways to remove actinic keratoses, including freezing with liquid nitrogen, treatment with creams, or treatment with a blue light procedure in the office.   5-fluorouracil cream is a  topical cream used to treat actinic keratoses. It works by interfering with the growth of abnormal fast-growing skin cells, such as actinic keratoses. These cells peel off and are replaced by healthy ones.   5-fluorouracil/calcipotriene is a combination of the 5-fluorouracil cream with a vitamin D analog cream called calcipotriene. The calcipotriene alone does not treat actinic keratoses. However, when it is combined with 5-fluorouracil, it helps the 5-fluorouracil treat the actinic keratoses much faster so that the same results can be achieved with a much shorter treatment time.  INSTRUCTIONS FOR 5-FLUOROURACIL/CALCIPOTRIENE CREAM:   5-fluorouracil/calcipotriene cream typically only needs to be used for 4-7 days. A thin layer should be applied twice a day to the treatment areas recommended by your physician.   If your physician prescribed you separate tubes of 5-fluourouracil and calcipotriene, apply a thin layer of 5-fluorouracil followed by a thin layer of calcipotriene.   Avoid contact with your eyes, nostrils, and mouth. Do not use 5-fluorouracil/calcipotriene cream on infected or open wounds.   You will develop redness, irritation and some crusting at areas where you have pre-cancer damage/actinic keratoses. IF YOU DEVELOP PAIN, BLEEDING, OR SIGNIFICANT CRUSTING, STOP THE TREATMENT EARLY - you have already gotten a good response and the actinic keratoses should clear up well.  Wash your hands after applying 5-fluorouracil 5% cream on your skin.   A moisturizer or sunscreen with a minimum SPF 30 should be applied each morning.   Once you have finished the treatment, you can apply a thin layer of Vaseline twice a day to irritated areas to soothe and calm the areas more quickly. If you experience significant discomfort, contact your physician.  For some patients it is necessary to repeat the treatment for best results.  SIDE EFFECTS: When using 5-fluorouracil/calcipotriene cream, you may  have mild irritation, such as redness, dryness, swelling, or a mild burning sensation. This usually resolves within 2 weeks. The more actinic keratoses you have, the more redness and inflammation you can expect during treatment. Eye irritation has been reported rarely. If this occurs, please let us know.  If you have any trouble using this cream, please call the office. If you have any other questions about this information, please do not hesitate to ask me before you leave the office.   Recommend daily broad spectrum sunscreen SPF 30+ to sun-exposed areas, reapply every 2 hours as needed. Call for new or changing lesions.  Staying in the shade or wearing long sleeves, sun glasses (UVA+UVB protection) and wide brim hats (4-inch brim around the entire circumference of the hat) are also recommended for sun protection.    Due to recent changes in healthcare laws, you may see results of your pathology and/or laboratory studies on MyChart before the doctors have had a chance to review them. We understand that in some cases there may be results that are confusing or concerning to you. Please understand that not all results are received at the same time and often the doctors may need to interpret multiple results in order to provide you with the best plan of care or course of treatment. Therefore, we ask that  you please give [place] 2 business days to thoroughly review all your results before contacting the office for clarification. Should we see a critical lab result, you will be contacted sooner.   If You Need Anything After Your Visit  If you have any questions or concerns for your doctor, please call our main line at [phone] and press option 4 to reach your doctor's medical assistant. If no one answers, please leave a voicemail as directed and we will return your call as soon as possible. Messages left after 4 pm will be answered the following business day.   You may also send [place] a message via [place].  We typically respond to MyChart messages within 1-2 business days.  For prescription refills, please ask your pharmacy to contact our office. Our fax number is [phone].  If you have an urgent issue when the clinic is closed that cannot wait until the next business day, you can page your doctor at the number below.    Please note that while we do our best to be available for urgent issues outside of office hours, we are not available 24/7.   If you have an urgent issue and are unable to reach [place], you may choose to seek medical care at your doctor's office, retail clinic, urgent care center, or emergency room.  If you have a medical emergency, please immediately call 911 or go to the emergency department.  Pager Numbers  - Dr. Nehemiah Massed: [phone]  - Dr. Laurence Ferrari: [phone]  - Dr. Nicole Kindred: [phone]  In the event of inclement weather, please call our main line at [phone] for an update on the status of any delays or closures.  Dermatology Medication Tips: Please keep the boxes that topical medications come in in order to help keep track of the instructions about where and how to use these. Pharmacies typically print the medication instructions only on the boxes and not directly on the medication tubes.   If your medication is too expensive, please contact our office at [phone] option 4 or send [place] a message through [place].   We are unable to tell what your co-pay for medications will be in advance as this is different depending on your insurance coverage. However, we may be able to find a substitute medication at lower cost or fill out paperwork to get insurance to cover a needed medication.   If a prior authorization is required to get your medication covered by your insurance company, please allow [place] 1-2 business days to complete this process.  Drug prices often vary depending on where the prescription is filled and some pharmacies may offer cheaper prices.  The  website www.goodrx.com contains coupons for medications through different pharmacies. The prices here do not account for what the cost may be with help from insurance (it may be cheaper with your insurance), but the website can give you the price if you did not use any insurance.  - You can print the associated coupon and take it with your prescription to the pharmacy.  - You may also stop by our office during regular business hours and pick up a GoodRx coupon card.  - If you need your prescription sent electronically to a different pharmacy, notify our office through [facility] or by phone at [phone] option 4.     Si Usted Necesita Algo Despus de Su Visita  Tambin puede enviarnos un mensaje a travs de Pharmacist, community. Por lo general respondemos a los mensajes de Therapist, occupational transcurso  de 1 a 2 das hbiles.  Para renovar recetas, por favor pida a su farmacia que se ponga en contacto con nuestra oficina. Harland Dingwall de fax es [place] [phone].  Si tiene un asunto urgente cuando la clnica est cerrada y que no puede esperar hasta el siguiente da hbil, puede llamar/localizar a su doctor(a) al nmero que aparece a continuacin.   Por favor, tenga en cuenta que aunque hacemos todo lo posible para estar disponibles para asuntos urgentes fuera del horario de [place], no estamos disponibles las 24 horas del da, los 7 das de la [place].   Si tiene un problema urgente y no puede comunicarse con nosotros, puede optar por buscar atencin mdica  en el consultorio de su doctor(a), en una clnica privada, en un centro de atencin urgente o en una sala de emergencias.  Si tiene Engineering geologist, por favor llame inmediatamente al 911 o vaya a la sala de emergencias.  Nmeros de bper  - Dr. Nehemiah Massed: [phone]  - Dra. Moye: [phone]  - Dra. Nicole Kindred: [phone]  En caso de inclemencias del [place], por favor llame a Johnsie Kindred principal al [phone] para una  actualizacin sobre el [place] de cualquier retraso o cierre.  Consejos para la medicacin en dermatologa: Por favor, guarde las cajas en las que vienen los medicamentos de uso tpico para ayudarle a seguir las instrucciones sobre dnde y cmo usarlos. Las farmacias generalmente imprimen las instrucciones del medicamento slo en las cajas y no directamente en los tubos del [place].   Si su medicamento es muy caro, por favor, pngase en contacto con Zigmund Daniel llamando al [phone] y presione la opcin 4 o envenos un mensaje a travs de Pharmacist, community.   No podemos decirle cul ser su copago por los medicamentos por adelantado ya que esto es diferente dependiendo de la cobertura de su seguro. Sin embargo, es posible que podamos encontrar un medicamento sustituto a Electrical engineer un formulario para que el seguro cubra el medicamento que se considera necesario.   Si se requiere una autorizacin previa para que su compaa de seguros [place] su medicamento, por favor permtanos de 1 a 2 das hbiles para completar este proceso.  Los precios de los medicamentos varan con frecuencia dependiendo del Environmental consultant de dnde se surte la receta y alguna farmacias pueden ofrecer precios ms baratos.  El sitio web www.goodrx.com tiene cupones para medicamentos de Airline pilot. Los precios aqu no tienen en cuenta lo que podra costar con la ayuda del seguro (puede ser ms barato con su seguro), pero el sitio web puede darle el precio si no utiliz Research scientist (physical sciences).  - Puede imprimir el cupn correspondiente y llevarlo con su receta a la farmacia.  - Tambin puede pasar por nuestra oficina durante el horario de atencin regular y Charity fundraiser una tarjeta de cupones de GoodRx.  - Si necesita que su receta se enve electrnicamente a una farmacia diferente, informe a nuestra oficina a travs de MyChart de [place] o por telfono llamando al [phone] y presione la opcin 4.

## 2022-05-14 NOTE — Progress Notes (Signed)
Follow-Up Visit   Subjective  Jerry Olsen is a 57 y.o. male who presents for the following: lesion (C/O spot on nose. Dur: 4 months. Looked like a whitehead at first. Raised, rough. Bled when squeezed ).  The patient has spots, moles and lesions to be evaluated, some may be new or changing and the patient has concerns that these could be cancer.  The following portions of the chart were reviewed this encounter and updated as appropriate:  Tobacco  Allergies  Meds  Problems  Med Hx  Surg Hx  Fam Hx      Review of Systems: No other skin or systemic complaints except as noted in HPI or Assessment and Plan.   Objective  Well appearing patient in no apparent distress; mood and affect are within normal limits.  A focused examination was performed including head, including the scalp, face, neck, nose, ears, eyelids, and lips. Relevant physical exam findings are noted in the Assessment and Plan.  Left Supratip of Nose, right nasal ala (2) Verrucous papules -- Discussed viral etiology and contagion.    Assessment & Plan  Other viral warts (2) Left Supratip of Nose, right nasal ala  Viral Wart (HPV) Counseling  Discussed viral / HPV (Human Papilloma Virus) etiology and risk of spread /infectivity to other areas of body as well as to other people.  Multiple treatments and methods may be required to clear warts and it is possible treatment may not be successful.  Treatment risks include discoloration; scarring and there is still potential for wart recurrence.  Cantharidin Plus is a blistering agent that comes from a beetle.  It needs to be washed off in about 4 hours after application.  Although it is painless when applied in office, it may cause symptoms of mild pain and burning several hours later.  Treated areas will swell and turn red, and blisters may form.  Vaseline and a bandaid may be applied until wound has healed.  Once healed, the skin may remain temporarily discolored.  It can  take weeks to months for pigmentation to return to normal.  Advised to wash off with soap and water in 1 hour.   Squaric Acid 3% applied to warts today. Prior to application reviewed risk of inflammation and irritation.   Destruction of lesion - Left Supratip of Nose, right nasal ala  Destruction method: cryotherapy   Informed consent: discussed and consent obtained   Lesion destroyed using liquid nitrogen: Yes   Region frozen until ice ball extended beyond lesion: Yes   Outcome: patient tolerated procedure well with no complications   Post-procedure details: wound care instructions given   Additional details:  Prior to procedure, discussed risks of blister formation, small wound, skin dyspigmentation, or rare scar following cryotherapy. Recommend Vaseline ointment to treated areas while healing.   Destruction of lesion - Left Supratip of Nose, right nasal ala  Destruction method: chemical removal   Informed consent: discussed and consent obtained   Timeout:  patient name, date of birth, surgical site, and procedure verified Chemical destruction method comment:  Cantharidin plus Procedure instructions: patient instructed to wash and dry area   Procedure instructions comment:  Wash off in 1 hour Outcome: patient tolerated procedure well with no complications   Post-procedure details: wound care instructions given   Additional details:  Squaric Acid 3% applied to warts today. Prior to application reviewed risk of inflammation and irritation.    Actinic Damage with PreCancerous Actinic Keratoses Counseling for Topical Chemotherapy  Management: Patient exhibits: - Severe, confluent actinic changes with pre-cancerous actinic keratoses that is secondary to cumulative UV radiation exposure over time - Condition that is severe; chronic, not at goal. - diffuse scaly erythematous macules and papules with underlying dyspigmentation - Discussed Prescription "Field Treatment" topical Chemotherapy  for Severe, Chronic Confluent Actinic Changes with Pre-Cancerous Actinic Keratoses Field treatment involves treatment of an entire area of skin that has confluent Actinic Changes (Sun/ Ultraviolet light damage) and PreCancerous Actinic Keratoses by method of PhotoDynamic Therapy (PDT) and/or prescription Topical Chemotherapy agents such as 5-fluorouracil, 5-fluorouracil/calcipotriene, and/or imiquimod.  The purpose is to decrease the number of clinically evident and subclinical PreCancerous lesions to prevent progression to development of skin cancer by chemically destroying early precancer changes that may or may not be visible.  It has been shown to reduce the risk of developing skin cancer in the treated area. As a result of treatment, redness, scaling, crusting, and open sores may occur during treatment course. One or more than one of these methods may be used and may have to be used several times to control, suppress and eliminate the PreCancerous changes. Discussed treatment course, expected reaction, and possible side effects. - Recommend daily broad spectrum sunscreen SPF 30+ to sun-exposed areas, reapply every 2 hours as needed.  - Staying in the shade or wearing long sleeves, sun glasses (UVA+UVB protection) and wide brim hats (4-inch brim around the entire circumference of the hat) are also recommended. - Call for new or changing lesions. - Start 5-fluorouracil/calcipotriene cream twice a day for 7 days to affected areas including scalp. Prescription sent to [facility]. Patient advised they will receive an email to purchase the medication online and have it sent to their home. Patient provided with handout reviewing treatment course and side effects and advised to call or message [place] on MyChart with any concerns.  Reviewed course of treatment and expected reaction.  Patient advised to expect inflammation and crusting and advised that erosions are possible.  Patient advised  to be diligent with sun protection during and after treatment. Counseled to keep medication out of reach of children and pets.   Return in about 4 weeks (around 06/11/2022) for Wart Follow UP.  I, Emelia Salisbury, CMA, am acting as scribe for Forest Gleason, MD.   Documentation: I have reviewed the above documentation for accuracy and completeness, and I agree with the above.  Forest Gleason, MD

## 2022-05-25 ENCOUNTER — Encounter: Payer: Self-pay | Admitting: Dermatology

## 2022-06-16 ENCOUNTER — Ambulatory Visit: Payer: Federal, State, Local not specified - PPO | Admitting: Dermatology

## 2022-06-16 ENCOUNTER — Encounter: Payer: Self-pay | Admitting: Dermatology

## 2022-06-16 VITALS — BP 145/87 | HR 82

## 2022-06-16 DIAGNOSIS — L578 Other skin changes due to chronic exposure to nonionizing radiation: Secondary | ICD-10-CM

## 2022-06-16 DIAGNOSIS — B078 Other viral warts: Secondary | ICD-10-CM

## 2022-06-16 NOTE — Patient Instructions (Signed)
Recommend treating wart at right nose once daily x 4 days with 5FU/calcipotriene.  Due to recent changes in healthcare laws, you may see results of your pathology and/or laboratory studies on MyChart before the doctors have had a chance to review them. We understand that in some cases there may be results that are confusing or concerning to you. Please understand that not all results are received at the same time and often the doctors may need to interpret multiple results in order to provide you with the best plan of care or course of treatment. Therefore, we ask that you please give [place] 2 business days to thoroughly review all your results before contacting the office for clarification. Should we see a critical lab result, you will be contacted sooner.   If You Need Anything After Your Visit  If you have any questions or concerns for your doctor, please call our main line at [phone] and press option 4 to reach your doctor's medical assistant. If no one answers, please leave a voicemail as directed and we will return your call as soon as possible. Messages left after 4 pm will be answered the following business day.   You may also send [place] a message via [place]. We typically respond to MyChart messages within 1-2 business days.  For prescription refills, please ask your pharmacy to contact our office. Our fax number is [phone].  If you have an urgent issue when the clinic is closed that cannot wait until the next business day, you can page your doctor at the number below.    Please note that while we do our best to be available for urgent issues outside of office hours, we are not available 24/7.   If you have an urgent issue and are unable to reach [place], you may choose to seek medical care at your doctor's office, retail clinic, urgent care center, or emergency room.  If you have a medical emergency, please immediately call 911 or go to the emergency department.  Pager Numbers  - Dr.  Nehemiah Massed: [phone]  - Dr. Laurence Ferrari: [phone]  - Dr. Nicole Kindred: [phone]  In the event of inclement weather, please call our main line at [phone] for an update on the status of any delays or closures.  Dermatology Medication Tips: Please keep the boxes that topical medications come in in order to help keep track of the instructions about where and how to use these. Pharmacies typically print the medication instructions only on the boxes and not directly on the medication tubes.   If your medication is too expensive, please contact our office at [phone] option 4 or send [place] a message through [place].   We are unable to tell what your co-pay for medications will be in advance as this is different depending on your insurance coverage. However, we may be able to find a substitute medication at lower cost or fill out paperwork to get insurance to cover a needed medication.   If a prior authorization is required to get your medication covered by your insurance company, please allow [place] 1-2 business days to complete this process.  Drug prices often vary depending on where the prescription is filled and some pharmacies may offer cheaper prices.  The website www.goodrx.com contains coupons for medications through different pharmacies. The prices here do not account for what the cost may be with help from insurance (it may be cheaper with your insurance), but the website can give you the price if you did not use  any insurance.  - You can print the associated coupon and take it with your prescription to the pharmacy.  - You may also stop by our office during regular business hours and pick up a GoodRx coupon card.  - If you need your prescription sent electronically to a different pharmacy, notify our office through [facility] or by phone at [phone] option 4.     Si Usted Necesita Algo Despus de Su Visita  Tambin puede enviarnos un mensaje a travs de Pharmacist, community. Por lo  general respondemos a los mensajes de MyChart en el transcurso de 1 a 2 das hbiles.  Para renovar recetas, por favor pida a su farmacia que se ponga en contacto con nuestra oficina. Harland Dingwall de fax es [place] [phone].  Si tiene un asunto urgente cuando la clnica est cerrada y que no puede esperar hasta el siguiente da hbil, puede llamar/localizar a su doctor(a) al nmero que aparece a continuacin.   Por favor, tenga en cuenta que aunque hacemos todo lo posible para estar disponibles para asuntos urgentes fuera del horario de [place], no estamos disponibles las 24 horas del da, los 7 das de la [place].   Si tiene un problema urgente y no puede comunicarse con nosotros, puede optar por buscar atencin mdica  en el consultorio de su doctor(a), en una clnica privada, en un centro de atencin urgente o en una sala de emergencias.  Si tiene Engineering geologist, por favor llame inmediatamente al 911 o vaya a la sala de emergencias.  Nmeros de bper  - Dr. Nehemiah Massed: [phone]  - Dra. Moye: [phone]  - Dra. Nicole Kindred: [phone]  En caso de inclemencias del [place], por favor llame a Johnsie Kindred principal al [phone] para una actualizacin sobre el [place] de cualquier retraso o cierre.  Consejos para la medicacin en dermatologa: Por favor, guarde las cajas en las que vienen los medicamentos de uso tpico para ayudarle a seguir las instrucciones sobre dnde y cmo usarlos. Las farmacias generalmente imprimen las instrucciones del medicamento slo en las cajas y no directamente en los tubos del [place].   Si su medicamento es muy caro, por favor, pngase en contacto con Zigmund Daniel llamando al [phone] y presione la opcin 4 o envenos un mensaje a travs de Pharmacist, community.   No podemos decirle cul ser su copago por los medicamentos por adelantado ya que esto es diferente dependiendo de la cobertura de su seguro. Sin embargo, es posible que podamos encontrar un  medicamento sustituto a Electrical engineer un formulario para que el seguro cubra el medicamento que se considera necesario.   Si se requiere una autorizacin previa para que su compaa de seguros [place] su medicamento, por favor permtanos de 1 a 2 das hbiles para completar este proceso.  Los precios de los medicamentos varan con frecuencia dependiendo del Environmental consultant de dnde se surte la receta y alguna farmacias pueden ofrecer precios ms baratos.  El sitio web www.goodrx.com tiene cupones para medicamentos de Airline pilot. Los precios aqu no tienen en cuenta lo que podra costar con la ayuda del seguro (puede ser ms barato con su seguro), pero el sitio web puede darle el precio si no utiliz Research scientist (physical sciences).  - Puede imprimir el cupn correspondiente y llevarlo con su receta a la farmacia.  - Tambin puede pasar por nuestra oficina durante el horario de atencin regular y Charity fundraiser una tarjeta de cupones de GoodRx.  - Si necesita que su receta se enve electrnicamente a  una farmacia diferente, informe a nuestra oficina a travs de MyChart de [place] o por telfono llamando al [phone] y presione la opcin 4.

## 2022-06-16 NOTE — Progress Notes (Signed)
   Follow-Up Visit   Subjective  Jerry Olsen is a 57 y.o. male who presents for the following: Follow-up (Patient here today for 4 week wart follow up. Warts treated at Left Supratip of Nose, right nasal ala with LN2, cantharidin plus and squaric acid. Patient advises he did well with treatment. ) and Actinic Keratosis (Patient also treated scalp with 5FU/calcipotriene twice daily x 7 days, finishing last Wednesday with good reaction. ).   The following portions of the chart were reviewed this encounter and updated as appropriate:   Tobacco  Allergies  Meds  Problems  Med Hx  Surg Hx  Fam Hx      Review of Systems:  No other skin or systemic complaints except as noted in HPI or Assessment and Plan.  Objective  Well appearing patient in no apparent distress; mood and affect are within normal limits.  A focused examination was performed including scalp, face. Relevant physical exam findings are noted in the Assessment and Plan.  Scalp Scaly erythematous plaques  right ala Verrucous papules -- Discussed viral etiology and contagion.     Assessment & Plan  Actinic skin damage Scalp  S/p 5FU/calcipotriene with good response.   Patient can repeat treatment if still feeling rough spots once healed.   Apply vaseline at least daily to area to speed healing.  - chronic, secondary to cumulative UV radiation exposure/sun exposure over time - diffuse scaly erythematous macules with underlying dyspigmentation - Recommend daily broad spectrum sunscreen SPF 30+ to sun-exposed areas, reapply every 2 hours as needed.  - Recommend staying in the shade or wearing long sleeves, sun glasses (UVA+UVB protection) and wide brim hats (4-inch brim around the entire circumference of the hat). - Call for new or changing lesions.    Other viral warts right ala  Viral Wart (HPV) Counseling  Discussed viral / HPV (Human Papilloma Virus) etiology and risk of spread /infectivity to other areas of  body as well as to other people.  Multiple treatments and methods may be required to clear warts and it is possible treatment may not be successful.  Treatment risks include discoloration; scarring and there is still potential for wart recurrence.  Left nose clear, right nose with very small residual lesion. Recommend patient treat at home with 5FU/calcipotriene once daily x 4 days. Can repeat if not clear. Call or recheck at f/u if not clear after 2 rounds of 5FU/calcipotriene  Reviewed course of treatment and expected reaction.      Return for TBSE, AK follow up, Warts in 3-4 months.  Graciella Belton, RMA, am acting as scribe for Forest Gleason, MD .  Documentation: I have reviewed the above documentation for accuracy and completeness, and I agree with the above.  Forest Gleason, MD

## 2022-06-25 ENCOUNTER — Emergency Department: Payer: Federal, State, Local not specified - PPO

## 2022-06-25 ENCOUNTER — Encounter: Payer: Self-pay | Admitting: Emergency Medicine

## 2022-06-25 ENCOUNTER — Emergency Department
Admission: EM | Admit: 2022-06-25 | Discharge: 2022-06-25 | Disposition: A | Discharge status: Home / Self Care | Payer: Federal, State, Local not specified - PPO | Attending: Student in an Organized Health Care Education/Training Program | Admitting: Student in an Organized Health Care Education/Training Program

## 2022-06-25 DIAGNOSIS — I10 Essential (primary) hypertension: Secondary | ICD-10-CM | POA: Diagnosis not present

## 2022-06-25 DIAGNOSIS — F439 Reaction to severe stress, unspecified: Secondary | ICD-10-CM | POA: Diagnosis not present

## 2022-06-25 DIAGNOSIS — R0789 Other chest pain: Secondary | ICD-10-CM | POA: Insufficient documentation

## 2022-06-25 LAB — CBC
HCT: 45.4 % (ref 39.0–52.0)
Hemoglobin: 15.2 g/dL (ref 13.0–17.0)
MCH: 29.9 pg (ref 26.0–34.0)
MCHC: 33.5 g/dL (ref 30.0–36.0)
MCV: 89.4 fL (ref 80.0–100.0)
Platelets: 219 10*3/uL (ref 150–400)
RBC: 5.08 MIL/uL (ref 4.22–5.81)
RDW: 12.7 % (ref 11.5–15.5)
WBC: 8.7 10*3/uL (ref 4.0–10.5)
nRBC: 0 % (ref 0.0–0.2)

## 2022-06-25 LAB — BASIC METABOLIC PANEL
Anion gap: 9 (ref 5–15)
BUN: 16 mg/dL (ref 6–20)
CO2: 23 mmol/L (ref 22–32)
Calcium: 9 mg/dL (ref 8.9–10.3)
Chloride: 104 mmol/L (ref 98–111)
Creatinine, Ser: 0.88 mg/dL (ref 0.61–1.24)
GFR, Estimated: >60 mL/min (ref >60)
Glucose, Bld: 92 mg/dL (ref 70–99)
Potassium: 3.8 mmol/L (ref 3.5–5.1)
Sodium: 136 mmol/L (ref 135–145)

## 2022-06-25 LAB — TROPONIN I (HIGH SENSITIVITY)
Troponin I (High Sensitivity): 4 ng/L (ref <18)
Troponin I (High Sensitivity): 5 ng/L (ref <18)

## 2022-06-25 MED ORDER — LORAZEPAM 0.5 MG PO TABS
0.5000 mg | ORAL_TABLET | Freq: Once | ORAL | Status: AC
  Administered 2022-06-25: 0.5 mg via ORAL
  Filled 2022-06-25: qty 1

## 2022-06-25 NOTE — ED Notes (Signed)
Introduced myself to patient. No complaints of chest pain at this time. Patient ambulated to the bathroom without assistance. He is back in bed resting.

## 2022-06-25 NOTE — ED Triage Notes (Signed)
Pt reports around 11 he sat down because he felt out of breath, and felt "funny in chest"- tightness. He has been working out so chalked it up to muscle pain. BP was elevated and he was diaphoretic.  Pain radiates to inside left arm. Hx of afib takes Eliquis. He reports he feels very fatigued.

## 2022-06-25 NOTE — ED Provider Notes (Signed)
[facility] Provider Note    Event Date/Time   First MD Initiated Contact with Patient 06/25/22 1654     (approximate)   History   Chest Pain   HPI  Jerry Olsen is a 57 y.o. male with a history of anxiety and hypertension presents to the ER for evaluation of left-sided chest pain that started around 11:00 this morning.  No any palpitations.  Denies any shortness of breath.  Did feel sweaty during this time.  Went and worked out.  He has been intermittent fasting.  Denies any abdominal pain.  No back pain.  No fevers or chills.  Has been worked up for similar symptoms in the past and was told it was related to anxiety.  He does endorse some increased stress at home over the past few weeks.  Denies any lower extremity swelling.  Has been compliant with his medications including Eliquis.     Physical Exam   Triage Vital Signs: ED Triage Vitals  Enc Vitals Group     BP 06/25/22 1647 (!) 153/102     Pulse Rate 06/25/22 1647 88     Resp 06/25/22 1647 (!) 22     Temp 06/25/22 1647 98 F (36.7 C)     Temp Source 06/25/22 1647 Oral     SpO2 06/25/22 1647 96 %     Weight --      Height --      Head Circumference --      Peak Flow --      Pain Score 06/25/22 1646 7     Pain Loc --      Pain Edu? --      Excl. in [place]? --     Most recent vital signs: Vitals:   06/25/22 1930 06/25/22 2000  BP: 111/81 133/82  Pulse: 73 86  Resp: 16 18  Temp:    SpO2: 95% 97%     Constitutional: Alert  Eyes: Conjunctivae are normal.  Head: Atraumatic. Nose: No congestion/rhinnorhea. Mouth/Throat: Mucous membranes are moist.   Neck: Painless ROM.  Cardiovascular:   Good peripheral circulation. Respiratory: Normal respiratory effort.  No retractions.  Gastrointestinal: Soft and nontender.  Musculoskeletal:  no deformity Neurologic:  MAE spontaneously. No gross focal neurologic deficits are appreciated.  Skin:  Skin is warm, dry and intact. No rash  noted. Psychiatric: Mood and affect are normal. Speech and behavior are normal.    ED Results / Procedures / Treatments   Labs (all labs ordered are listed, but only abnormal results are displayed) Labs Reviewed  BASIC METABOLIC PANEL  CBC  TROPONIN I (HIGH SENSITIVITY)  TROPONIN I (HIGH SENSITIVITY)     EKG  ED ECG REPORT I, Merlyn Lot, the attending physician, personally viewed and interpreted this ECG.   Date: 06/25/2022  EKG Time: 16:44  Rate: 80  Rhythm: sinus  Axis: normal  Intervals:normal  ST&T Change: no stemi, no depressions e   RADIOLOGY Please see ED Course for my review and interpretation.  I personally reviewed all radiographic images ordered to evaluate for the above acute complaints and reviewed radiology reports and findings.  These findings were personally discussed with the patient.  Please see medical record for radiology report.    PROCEDURES:  Critical Care performed:   Procedures   MEDICATIONS ORDERED IN ED: Medications  LORazepam (ATIVAN) tablet 0.5 mg (0.5 mg Oral Given 06/25/22 1813)     IMPRESSION / MDM / ASSESSMENT AND PLAN / ED COURSE  I reviewed the triage vital signs and the nursing notes.                              Differential diagnosis includes, but is not limited to, ACS, pericarditis, esophagitis, boerhaaves, pe, dissection, pna, bronchitis, costochondritis  Patient presenting to the ER for evaluation of symptoms as described above.  Based on symptoms, risk factors and considered above differential, this presenting complaint could reflect a potentially life-threatening illness therefore the patient will be placed on continuous pulse oximetry and telemetry for monitoring.  Laboratory evaluation will be sent to evaluate for the above complaints.  Is nontoxic-appearing.  Low risk by heart score.  Will observe for serial troponins.  Doubt dysrhythmia low suspicion for ACS doubt dissection.  Not consistent with PE he is  not hypoxic not tachycardic and he is already on Eliquis.  Chest x-ray on my review and interpretation does not show any evidence of consolidation or pneumothorax.   Clinical Course as of 06/25/22 2040  Thu Jun 25, 2022  2039 Reassessed.  Repeat troponin negative.  Remains hemodynamically stable in no acute distress.  States he was previously on antianxiety medications that he has stopped.  Will talk to his PCP about restarting those.  At this point do believe he is stable and appropriate for outpatient follow-up.  Patient and family agreeable to plan. [PR]    Clinical Course User Index [PR] Merlyn Lot, MD      FINAL CLINICAL IMPRESSION(S) / ED DIAGNOSES   Final diagnoses:  Atypical chest pain     Rx / DC Orders   ED Discharge Orders     None        Note:  This document was prepared using Dragon voice recognition software and may include unintentional dictation errors.    Merlyn Lot, MD 06/25/22 2040

## 2022-06-25 NOTE — ED Notes (Signed)
E-signature pad unavailable - Pt verbalized understanding of D/C information - no additional concerns at this time.  

## 2022-08-10 DIAGNOSIS — F419 Anxiety disorder, unspecified: Secondary | ICD-10-CM | POA: Insufficient documentation

## 2022-08-20 ENCOUNTER — Other Ambulatory Visit: Payer: Self-pay | Admitting: Internal Medicine

## 2022-08-20 DIAGNOSIS — R748 Abnormal levels of other serum enzymes: Secondary | ICD-10-CM

## 2022-08-28 ENCOUNTER — Ambulatory Visit
Admission: RE | Admit: 2022-08-28 | Discharge: 2022-08-28 | Disposition: A | Discharge status: Home / Self Care | Payer: Federal, State, Local not specified - PPO | Source: Ambulatory Visit | Attending: Internal Medicine | Admitting: Internal Medicine

## 2022-08-28 DIAGNOSIS — R748 Abnormal levels of other serum enzymes: Secondary | ICD-10-CM | POA: Diagnosis present

## 2022-08-31 DIAGNOSIS — K760 Fatty (change of) liver, not elsewhere classified: Secondary | ICD-10-CM | POA: Insufficient documentation

## 2022-09-01 DIAGNOSIS — E538 Deficiency of other specified B group vitamins: Secondary | ICD-10-CM | POA: Insufficient documentation

## 2022-09-30 ENCOUNTER — Ambulatory Visit: Payer: Federal, State, Local not specified - PPO | Admitting: Dermatology

## 2022-09-30 DIAGNOSIS — D229 Melanocytic nevi, unspecified: Secondary | ICD-10-CM

## 2022-09-30 DIAGNOSIS — R238 Other skin changes: Secondary | ICD-10-CM

## 2022-09-30 DIAGNOSIS — B079 Viral wart, unspecified: Secondary | ICD-10-CM

## 2022-09-30 DIAGNOSIS — Z1283 Encounter for screening for malignant neoplasm of skin: Secondary | ICD-10-CM | POA: Diagnosis not present

## 2022-09-30 DIAGNOSIS — L570 Actinic keratosis: Secondary | ICD-10-CM

## 2022-09-30 DIAGNOSIS — L814 Other melanin hyperpigmentation: Secondary | ICD-10-CM

## 2022-09-30 DIAGNOSIS — D1801 Hemangioma of skin and subcutaneous tissue: Secondary | ICD-10-CM

## 2022-09-30 DIAGNOSIS — L821 Other seborrheic keratosis: Secondary | ICD-10-CM

## 2022-09-30 DIAGNOSIS — L578 Other skin changes due to chronic exposure to nonionizing radiation: Secondary | ICD-10-CM

## 2022-09-30 DIAGNOSIS — W908XXA Exposure to other nonionizing radiation, initial encounter: Secondary | ICD-10-CM

## 2022-09-30 DIAGNOSIS — X32XXXA Exposure to sunlight, initial encounter: Secondary | ICD-10-CM

## 2022-09-30 DIAGNOSIS — Z872 Personal history of diseases of the skin and subcutaneous tissue: Secondary | ICD-10-CM

## 2022-09-30 NOTE — Patient Instructions (Addendum)

## 2022-09-30 NOTE — Progress Notes (Signed)
Follow-Up Visit   Subjective  Jerry Olsen is a 57 y.o. male who presents for the following: Skin Cancer Screening and Full Body Skin Exam  The patient presents for Total-Body Skin Exam (TBSE) for skin cancer screening and mole check. The patient has spots, moles and lesions to be evaluated, some may be new or changing and the patient has concerns that these could be cancer.  Hx of wart at right ala. Treated at home with 5FU/calcipotriene, patient advises clear. Hx of AK's at scalp.  The following portions of the chart were reviewed this encounter and updated as appropriate: medications, allergies, medical history  Review of Systems:  No other skin or systemic complaints except as noted in HPI or Assessment and Plan.  Objective  Well appearing patient in no apparent distress; mood and affect are within normal limits.  A full examination was performed including scalp, head, eyes, ears, nose, lips, neck, chest, axillae, abdomen, back, buttocks, bilateral upper extremities, bilateral lower extremities, hands, feet, fingers, toes, fingernails, and toenails. All findings within normal limits unless otherwise noted below.   Relevant physical exam findings are noted in the Assessment and Plan.  L 4th toe x 1 Verrucous papules -- Discussed viral etiology and contagion.   R lat zygoma x 1, nasal dorsum x 1, L 3rd finger x 1 (3) Erythematous thin papules/macules with gritty scale.     Assessment & Plan   LENTIGINES, SEBORRHEIC KERATOSES, HEMANGIOMAS - Benign normal skin lesions - Benign-appearing - Call for any changes  MELANOCYTIC NEVI - Tan-brown and/or pink-flesh-colored symmetric macules and papules - Benign appearing on exam today - Observation - Call clinic for new or changing moles - Recommend daily use of broad spectrum spf 30+ sunscreen to sun-exposed areas.   ACTINIC DAMAGE - chronic, secondary to cumulative UV radiation exposure/sun exposure over time - diffuse scaly  erythematous macules with underlying dyspigmentation - Recommend daily broad spectrum sunscreen SPF 30+ to sun-exposed areas, reapply every 2 hours as needed.  - Recommend staying in the shade or wearing long sleeves, sun glasses (UVA+UVB protection) and wide brim hats (4-inch brim around the entire circumference of the hat). - Call for new or changing lesions. - Recommend taking Heliocare sun protection supplement daily in sunny weather for additional sun protection. For maximum protection on the sunniest days, you can take up to 2 capsules of regular Heliocare OR take 1 capsule of Heliocare Ultra.    Viral warts, unspecified type L 4th toe x 1  Destruction of lesion - L 4th toe x 1 Complexity: simple   Destruction method: cryotherapy   Informed consent: discussed and consent obtained   Timeout:  patient name, date of birth, surgical site, and procedure verified Lesion destroyed using liquid nitrogen: Yes   Region frozen until ice ball extended beyond lesion: Yes   Outcome: patient tolerated procedure well with no complications   Post-procedure details: wound care instructions given    Destruction of lesion - L 4th toe x 1  Destruction method: chemical removal   Informed consent: discussed and consent obtained   Timeout:  patient name, date of birth, surgical site, and procedure verified Chemical destruction method comment:  Squaric acid 3%, cantharidin plus Procedure instructions: patient instructed to wash and dry area   Outcome: patient tolerated procedure well with no complications   Post-procedure details: wound care instructions given   Additional details:  Patient advised to set alarm to remind them to wash off with soap and water at  the directed time.  AK (actinic keratosis) (3) R lat zygoma x 1, nasal dorsum x 1, L 3rd finger x 1  Destruction of lesion - R lat zygoma x 1, nasal dorsum x 1, L 3rd finger x 1 Complexity: simple   Destruction method: cryotherapy   Informed  consent: discussed and consent obtained   Timeout:  patient name, date of birth, surgical site, and procedure verified Lesion destroyed using liquid nitrogen: Yes   Region frozen until ice ball extended beyond lesion: Yes   Outcome: patient tolerated procedure well with no complications   Post-procedure details: wound care instructions given     INFLAMMATORY PAPULE Exam: excoriated inflammatory papule without features suspicious for malignancy on dermoscopy  Treatment Plan: Recommend Vaseline to healing crust in the ear.   SKIN CANCER SCREENING PERFORMED TODAY.  Return in about 1 year (around 09/30/2023) for TBSE - Dr. Katrinka Blazing.  Maylene Roes, CMA, am acting as scribe for Darden Dates, MD .  Documentation: I have reviewed the above documentation for accuracy and completeness, and I agree with the above.  Darden Dates, MD

## 2023-09-30 ENCOUNTER — Ambulatory Visit: Payer: Federal, State, Local not specified - PPO | Admitting: Dermatology

## 2023-09-30 ENCOUNTER — Encounter: Payer: Self-pay | Admitting: Dermatology

## 2023-09-30 DIAGNOSIS — Z1283 Encounter for screening for malignant neoplasm of skin: Secondary | ICD-10-CM

## 2023-09-30 DIAGNOSIS — L814 Other melanin hyperpigmentation: Secondary | ICD-10-CM

## 2023-09-30 DIAGNOSIS — L570 Actinic keratosis: Secondary | ICD-10-CM | POA: Diagnosis not present

## 2023-09-30 DIAGNOSIS — I781 Nevus, non-neoplastic: Secondary | ICD-10-CM

## 2023-09-30 DIAGNOSIS — L578 Other skin changes due to chronic exposure to nonionizing radiation: Secondary | ICD-10-CM

## 2023-09-30 DIAGNOSIS — W908XXA Exposure to other nonionizing radiation, initial encounter: Secondary | ICD-10-CM

## 2023-09-30 DIAGNOSIS — L720 Epidermal cyst: Secondary | ICD-10-CM

## 2023-09-30 DIAGNOSIS — D492 Neoplasm of unspecified behavior of bone, soft tissue, and skin: Secondary | ICD-10-CM

## 2023-09-30 DIAGNOSIS — L821 Other seborrheic keratosis: Secondary | ICD-10-CM

## 2023-09-30 DIAGNOSIS — D229 Melanocytic nevi, unspecified: Secondary | ICD-10-CM

## 2023-09-30 DIAGNOSIS — D1801 Hemangioma of skin and subcutaneous tissue: Secondary | ICD-10-CM

## 2023-09-30 DIAGNOSIS — B353 Tinea pedis: Secondary | ICD-10-CM

## 2023-09-30 DIAGNOSIS — I8393 Asymptomatic varicose veins of bilateral lower extremities: Secondary | ICD-10-CM

## 2023-09-30 NOTE — Patient Instructions (Addendum)
 For feet: Recommend OTC terbinafine or Lamisil cream, apply twice a day to affected areas, feet.   Cryotherapy Aftercare  Wash gently with soap and water everyday.   Apply Vaseline and Band-Aid daily until healed.    Recommend daily broad spectrum sunscreen SPF 30+ to sun-exposed areas, reapply every 2 hours as needed. Call for new or changing lesions.  Staying in the shade or wearing long sleeves, sun glasses (UVA+UVB protection) and wide brim hats (4-inch brim around the entire circumference of the hat) are also recommended for sun protection.     Melanoma ABCDEs  Melanoma is the most dangerous type of skin cancer, and is the leading cause of death from skin disease.  You are more likely to develop melanoma if you: Have light-colored skin, light-colored eyes, or red or blond hair Spend a lot of time in the sun Tan regularly, either outdoors or in a tanning bed Have had blistering sunburns, especially during childhood Have a close family member who has had a melanoma Have atypical moles or large birthmarks  Early detection of melanoma is key since treatment is typically straightforward and cure rates are extremely high if we catch it early.   The first sign of melanoma is often a change in a mole or a new dark spot.  The ABCDE system is a way of remembering the signs of melanoma.  A for asymmetry:  The two halves do not match. B for border:  The edges of the growth are irregular. C for color:  A mixture of colors are present instead of an even brown color. D for diameter:  Melanomas are usually (but not always) greater than 6mm - the size of a pencil eraser. E for evolution:  The spot keeps changing in size, shape, and color.  Please check your skin once per month between visits. You can use a small mirror in front and a large mirror behind you to keep an eye on the back side or your body.   If you see any new or changing lesions before your next follow-up, please call to  schedule a visit.  Please continue daily skin protection including broad spectrum sunscreen SPF 30+ to sun-exposed areas, reapplying every 2 hours as needed when you're outdoors.   Staying in the shade or wearing long sleeves, sun glasses (UVA+UVB protection) and wide brim hats (4-inch brim around the entire circumference of the hat) are also recommended for sun protection.     Due to recent changes in healthcare laws, you may see results of your pathology and/or laboratory studies on MyChart before the doctors have had a chance to review them. We understand that in some cases there may be results that are confusing or concerning to you. Please understand that not all results are received at the same time and often the doctors may need to interpret multiple results in order to provide you with the best plan of care or course of treatment. Therefore, we ask that you please give us  2 business days to thoroughly review all your results before contacting the office for clarification. Should we see a critical lab result, you will be contacted sooner.   If You Need Anything After Your Visit  If you have any questions or concerns for your doctor, please call our main line at [phone] and press option 4 to reach your doctor's medical assistant. If no one answers, please leave a voicemail as directed and we will return your call as soon as possible. Messages left  after 4 pm will be answered the following business day.   You may also send us  a message via MyChart. We typically respond to MyChart messages within 1-2 business days.  For prescription refills, please ask your pharmacy to contact our office. Our fax number is [phone].  If you have an urgent issue when the clinic is closed that cannot wait until the next business day, you can page your doctor at the number below.    Please note that while we do our best to be available for urgent issues outside of office hours, we are not available  24/7.   If you have an urgent issue and are unable to reach us , you may choose to seek medical care at your doctor's office, retail clinic, urgent care center, or emergency room.  If you have a medical emergency, please immediately call 911 or go to the emergency department.  Pager Numbers  - Dr. Bary Likes: [phone]  - Dr. Annette Barters: [phone]  - Dr. Felipe Horton: [phone]   In the event of inclement weather, please call our main line at [phone] for an update on the status of any delays or closures.  Dermatology Medication Tips: Please keep the boxes that topical medications come in in order to help keep track of the instructions about where and how to use these. Pharmacies typically print the medication instructions only on the boxes and not directly on the medication tubes.   If your medication is too expensive, please contact our office at [phone] option 4 or send us  a message through MyChart.   We are unable to tell what your co-pay for medications will be in advance as this is different depending on your insurance coverage. However, we may be able to find a substitute medication at lower cost or fill out paperwork to get insurance to cover a needed medication.   If a prior authorization is required to get your medication covered by your insurance company, please allow us  1-2 business days to complete this process.  Drug prices often vary depending on where the prescription is filled and some pharmacies may offer cheaper prices.  The website www.goodrx.com contains coupons for medications through different pharmacies. The prices here do not account for what the cost may be with help from insurance (it may be cheaper with your insurance), but the website can give you the price if you did not use any insurance.  - You can print the associated coupon and take it with your prescription to the pharmacy.  - You may also stop by our office during regular business hours and pick  up a GoodRx coupon card.  - If you need your prescription sent electronically to a different pharmacy, notify our office through [facility] or by phone at [phone] option 4.     Si Usted Necesita Algo Despus de Su Visita  Tambin puede enviarnos un mensaje a travs de Clinical cytogeneticist. Por lo general respondemos a los mensajes de MyChart en el transcurso de 1 a 2 das hbiles.  Para renovar recetas, por favor pida a su farmacia que se ponga en contacto con nuestra oficina. Franz Jacks de fax es [place] [phone].  Si tiene un asunto urgente cuando la clnica est cerrada y que no puede esperar hasta el siguiente da hbil, puede llamar/localizar a su doctor(a) al nmero que aparece a continuacin.   Por favor, tenga en cuenta que aunque hacemos todo lo posible para estar disponibles para asuntos urgentes fuera del horario de oficina, no estamos  disponibles las 24 horas del da, los 7 [address]  [address] de la [place].   Si tiene un problema urgente y no puede comunicarse con nosotros, puede optar por buscar atencin mdica  en el consultorio de su doctor(a), en una clnica privada, en un centro de atencin urgente o en una sala de emergencias.  Si tiene Engineer, drilling, por favor llame inmediatamente al 911 o vaya a la sala de emergencias.  Nmeros de bper  - Dr. Bary Likes: [phone]  - Dra. Annette Barters: [phone]  - Dr. Felipe Horton: [phone]   En caso de inclemencias del tiempo, por favor llame a Lajuan Pila principal al [phone] para una actualizacin sobre el [place] de cualquier retraso o cierre.  Consejos para la medicacin en dermatologa: Por favor, guarde las cajas en las que vienen los medicamentos de uso tpico para ayudarle a seguir las instrucciones sobre dnde y cmo usarlos. Las farmacias generalmente imprimen las instrucciones del medicamento slo en las cajas y no directamente en los tubos del [place].   Si su medicamento es muy caro, por favor, pngase en  contacto con Bettyjane Brunet llamando al [phone] y presione la opcin 4 o envenos un mensaje a travs de Clinical cytogeneticist.   No podemos decirle cul ser su copago por los medicamentos por adelantado ya que esto es diferente dependiendo de la cobertura de su seguro. Sin embargo, es posible que podamos encontrar un medicamento sustituto a Audiological scientist un formulario para que el seguro cubra el medicamento que se considera necesario.   Si se requiere una autorizacin previa para que su compaa de seguros [place] su medicamento, por favor permtanos de 1 a 2 das hbiles para completar este proceso.  Los precios de los medicamentos varan con frecuencia dependiendo del Environmental consultant de dnde se surte la receta y alguna farmacias pueden ofrecer precios ms baratos.  El sitio web www.goodrx.com tiene cupones para medicamentos de Health and safety inspector. Los precios aqu no tienen en cuenta lo que podra costar con la ayuda del seguro (puede ser ms barato con su seguro), pero el sitio web puede darle el precio si no utiliz Tourist information centre manager.  - Puede imprimir el cupn correspondiente y llevarlo con su receta a la farmacia.  - Tambin puede pasar por nuestra oficina durante el horario de atencin regular y Education officer, museum una tarjeta de cupones de GoodRx.  - Si necesita que su receta se enve electrnicamente a una farmacia diferente, informe a nuestra oficina a travs de MyChart de [place] o por telfono llamando al [phone] y presione la opcin 4.

## 2023-09-30 NOTE — Progress Notes (Signed)
 Follow-Up Visit   Subjective  Jerry Olsen is a 58 y.o. male who presents for the following: Skin Cancer Screening and Full Body Skin Exam. Place at R ear not sore or painful patient reports his wife is the one that noticed it.   The patient presents for Total-Body Skin Exam (TBSE) for skin cancer screening and mole check. The patient has spots, moles and lesions to be evaluated, some may be new or changing and the patient may have concern these could be cancer.   The following portions of the chart were reviewed this encounter and updated as appropriate: medications, allergies, medical history  Review of Systems:  No other skin or systemic complaints except as noted in HPI or Assessment and Plan.  Objective  Well appearing patient in no apparent distress; mood and affect are within normal limits.  A full examination was performed including scalp, head, eyes, ears, nose, lips, neck, chest, axillae, abdomen, back, buttocks, bilateral upper extremities, bilateral lower extremities, hands, feet, fingers, toes, fingernails, and toenails. All findings within normal limits unless otherwise noted below.   Relevant physical exam findings are noted in the Assessment and Plan.  R dorsal hand x5, R neck x1, Inferior to R earlobe x1, L dorsal hand x3, L wrist x1 (11) Pink scaly macules Right upper arm 5.5 mm pink thin papule with slight scaling   Assessment & Plan   SKIN CANCER SCREENING PERFORMED TODAY.  ACTINIC DAMAGE - Chronic condition, secondary to cumulative UV/sun exposure - diffuse scaly erythematous macules with underlying dyspigmentation - Recommend daily broad spectrum sunscreen SPF 30+ to sun-exposed areas, reapply every 2 hours as needed.  - Staying in the shade or wearing long sleeves, sun glasses (UVA+UVB protection) and wide brim hats (4-inch brim around the entire circumference of the hat) are also recommended for sun protection.  - Call for new or changing  lesions.  LENTIGINES, SEBORRHEIC KERATOSES, HEMANGIOMAS - Benign normal skin lesions - Benign-appearing - Call for any changes  SEBORRHEIC KERATOSIS - Stuck-on, waxy, tan-brown papules and/or plaques  - Benign-appearing - Discussed benign etiology and prognosis. - Observe - Call for any changes  MELANOCYTIC NEVI - Tan-brown and/or pink-flesh-colored symmetric macules and papules - Benign appearing on exam today - Observation - Call clinic for new or changing moles - Recommend daily use of broad spectrum spf 30+ sunscreen to sun-exposed areas.   Varicose Veins/Spider Veins - Dilated blue, purple or red veins at the lower extremities - Reassured - Smaller vessels can be treated by sclerotherapy (a procedure to inject a medicine into the veins to make them disappear) if desired, but the treatment is not covered by insurance. Larger vessels may be covered if symptomatic and we would refer to vascular surgeon if treatment desired.  TINEA PEDIS L dorsal foot Exam: pink scaly plaque L dorsal foot. Onychomycosis R foot  Treatment Plan: Recommend OTC terbinafine cream, apply BID to aa, feet.   SCAR- secondary to injury several years ago Exam: Dyspigmented smooth macule or patch. Benign-appearing.  Observation.  Call clinic for new or changing lesions. Recommend daily broad spectrum sunscreen SPF 30+, reapply every 2 hours as needed. Treatment: Recommend Serica moisturizing scar formula cream every night or [facility] brand or Mederma silicone scar sheet every night for the first year after a scar appears to help with scar remodeling if desired. Scars remodel on their own for a full year and will gradually improve in appearance over time.   ACTINIC DAMAGE WITH PRECANCEROUS ACTINIC KERATOSES  Counseling for Topical Chemotherapy Management: Patient exhibits: - Severe, confluent actinic changes with pre-cancerous actinic keratoses that is secondary to cumulative UV radiation exposure over  time - Condition that is severe; chronic, not at goal. - diffuse scaly erythematous macules and papules with underlying dyspigmentation - Discussed Prescription "Field Treatment" topical Chemotherapy for Severe, Chronic Confluent Actinic Changes with Pre-Cancerous Actinic Keratoses Field treatment involves treatment of an entire area of skin that has confluent Actinic Changes (Sun/ Ultraviolet light damage) and PreCancerous Actinic Keratoses by method of PhotoDynamic Therapy (PDT) and/or prescription Topical Chemotherapy agents such as 5-fluorouracil, 5-fluorouracil/calcipotriene, and/or imiquimod.  The purpose is to decrease the number of clinically evident and subclinical PreCancerous lesions to prevent progression to development of skin cancer by chemically destroying early precancer changes that may or may not be visible.  It has been shown to reduce the risk of developing skin cancer in the treated area. As a result of treatment, redness, scaling, crusting, and open sores may occur during treatment course. One or more than one of these methods may be used and may have to be used several times to control, suppress and eliminate the PreCancerous changes. Discussed treatment course, expected reaction, and possible side effects. - Recommend daily broad spectrum sunscreen SPF 30+ to sun-exposed areas, reapply every 2 hours as needed.  - Staying in the shade or wearing long sleeves, sun glasses (UVA+UVB protection) and wide brim hats (4-inch brim around the entire circumference of the hat) are also recommended. - Call for new or changing lesions. 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   Benign superficial skin cysts. Benign-appearing.  Observation.  Call clinic for new or changing lesions.  Recommend daily use of broad spectrum spf 30+ sunscreen to sun-exposed areas.   AK (ACTINIC KERATOSIS) (11) R dorsal hand x5, R  neck x1, Inferior to R earlobe x1, L dorsal hand x3, L wrist x1 (11) Actinic keratoses are precancerous spots that appear secondary to cumulative UV radiation exposure/sun exposure over time. They are chronic with expected duration over 1 year. A portion of actinic keratoses will progress to squamous cell carcinoma of the skin. It is not possible to reliably predict which spots will progress to skin cancer and so treatment is recommended to prevent development of skin cancer.  Recommend daily broad spectrum sunscreen SPF 30+ to sun-exposed areas, reapply every 2 hours as needed.  Recommend staying in the shade or wearing long sleeves, sun glasses (UVA+UVB protection) and wide brim hats (4-inch brim around the entire circumference of the hat). Call for new or changing lesions. Destruction of lesion - R dorsal hand x5, R neck x1, Inferior to R earlobe x1, L dorsal hand x3, L wrist x1 (11) Complexity: simple   Destruction method: cryotherapy   Informed consent: discussed and consent obtained   Timeout:  patient name, date of birth, surgical site, and procedure verified Lesion destroyed using liquid nitrogen: Yes   Region frozen until ice ball extended beyond lesion: Yes   Cryo cycles: 1 or 2. Outcome: patient tolerated procedure well with no complications   Post-procedure details: wound care instructions given   NEOPLASM OF SKIN Right upper arm Discussed biopsy today, patient prefers to observe and recheck at follow-up. Photos taken today.  MULTIPLE BENIGN NEVI   LENTIGINES   ACTINIC ELASTOSIS   SEBORRHEIC KERATOSES   CHERRY ANGIOMA   TINEA PEDIS OF LEFT FOOT   Return in 3 months (  on 12/31/2023) for 3 month recheck place at R arm, 1 year TBSE, HxAK w/ Dr. Felipe Horton .  I, Jacquelynn V. Grier Leber, CMA, am acting as scribe for Harris Liming, MD .   Documentation: I have reviewed the above documentation for accuracy and completeness, and I agree with the above.  Harris Liming,  MD

## 2024-01-04 ENCOUNTER — Encounter: Payer: Self-pay | Admitting: Dermatology

## 2024-01-04 ENCOUNTER — Ambulatory Visit: Admitting: Dermatology

## 2024-01-04 DIAGNOSIS — L578 Other skin changes due to chronic exposure to nonionizing radiation: Secondary | ICD-10-CM

## 2024-01-04 DIAGNOSIS — R21 Rash and other nonspecific skin eruption: Secondary | ICD-10-CM

## 2024-01-04 DIAGNOSIS — L570 Actinic keratosis: Secondary | ICD-10-CM | POA: Diagnosis not present

## 2024-01-04 DIAGNOSIS — W908XXA Exposure to other nonionizing radiation, initial encounter: Secondary | ICD-10-CM

## 2024-01-04 DIAGNOSIS — L2489 Irritant contact dermatitis due to other agents: Secondary | ICD-10-CM

## 2024-01-04 DIAGNOSIS — Z7189 Other specified counseling: Secondary | ICD-10-CM

## 2024-01-04 MED ORDER — HYDROCORTISONE 2.5 % EX CREA: TOPICAL_CREAM | CUTANEOUS | 11 refills | Status: AC

## 2024-01-04 MED ORDER — KETOCONAZOLE 2 % EX CREA: TOPICAL_CREAM | CUTANEOUS | 0 refills | Status: AC

## 2024-01-04 NOTE — Patient Instructions (Signed)

## 2024-01-04 NOTE — Progress Notes (Signed)
 Follow-Up Visit   Subjective  Jerry Olsen is a 58 y.o. male who presents for the following: Recheck of lesion on the R arm, resolved per pt. Recheck Aks on the ears, arms, and hands, previously tx with LN2. Pt c/o rash on the R axilla x 1 mth. Uses OTC creams for a few days, HC cream seemed to help the most. Pt uses Old Spice deodorant.   The following portions of the chart were reviewed this encounter and updated as appropriate: medications, allergies, medical history  Review of Systems:  No other skin or systemic complaints except as noted in HPI or Assessment and Plan.  Objective  Well appearing patient in no apparent distress; mood and affect are within normal limits.   A focused examination was performed of the following areas: the face, arms, hands, axilla   Relevant exam findings are noted in the Assessment and Plan.  Frontal scalp x 5, R vertex x 1, L forearm x 2, R wrist x 1 (9) Pink scaly macules  Assessment & Plan   Rash Exam: Pink macerated patch R axillary vault. No enhancement with wood's lamp. L axilla clear  Differential diagnosis:  Irritant contact dermatitis vs intertrigo vs candidiasis > psoriasis >> hailey hailey  Treatment Plan: Start Ketoconazole  2% cream and HC 2.5% cream mix together and apply BID until clear. If not clearing with treatment consider bx.   ACTINIC DAMAGE WITH PRECANCEROUS ACTINIC KERATOSES Counseling for Topical Chemotherapy Management: Patient exhibits: - Severe, confluent actinic changes with pre-cancerous actinic keratoses that is secondary to cumulative UV radiation exposure over time - Condition that is severe; chronic, not at goal. - diffuse scaly erythematous macules and papules with underlying dyspigmentation - Discussed Prescription Field Treatment topical Chemotherapy for Severe, Chronic Confluent Actinic Changes with Pre-Cancerous Actinic Keratoses Field treatment involves treatment of an entire area of skin that has  confluent Actinic Changes (Sun/ Ultraviolet light damage) and PreCancerous Actinic Keratoses by method of PhotoDynamic Therapy (PDT) and/or prescription Topical Chemotherapy agents such as 5-fluorouracil, 5-fluorouracil/calcipotriene, and/or imiquimod.  The purpose is to decrease the number of clinically evident and subclinical PreCancerous lesions to prevent progression to development of skin cancer by chemically destroying early precancer changes that may or may not be visible.  It has been shown to reduce the risk of developing skin cancer in the treated area. As a result of treatment, redness, scaling, crusting, and open sores may occur during treatment course. One or more than one of these methods may be used and may have to be used several times to control, suppress and eliminate the PreCancerous changes. Discussed treatment course, expected reaction, and possible side effects. - Recommend daily broad spectrum sunscreen SPF 30+ to sun-exposed areas, reapply every 2 hours as needed.  - Staying in the shade or wearing long sleeves, sun glasses (UVA+UVB protection) and wide brim hats (4-inch brim around the entire circumference of the hat) are also recommended. - Call for new or changing lesions. - Pt defers topical tx at this time.  NEOPLASM OF SKIN Right upper arm Clear today compared to previous photo. RTC recurrence.  AK (ACTINIC KERATOSIS) (9) Frontal scalp x 5, R vertex x 1, L forearm x 2, R wrist x 1 (9) Actinic keratoses are precancerous spots that appear secondary to cumulative UV radiation exposure/sun exposure over time. They are chronic with expected duration over 1 year. A portion of actinic keratoses will progress to squamous cell carcinoma of the skin. It is not possible to reliably  predict which spots will progress to skin cancer and so treatment is recommended to prevent development of skin cancer.  Recommend daily broad spectrum sunscreen SPF 30+ to sun-exposed areas, reapply every  2 hours as needed.  Recommend staying in the shade or wearing long sleeves, sun glasses (UVA+UVB protection) and wide brim hats (4-inch brim around the entire circumference of the hat). Call for new or changing lesions. Destruction of lesion - Frontal scalp x 5, R vertex x 1, L forearm x 2, R wrist x 1 (9) Complexity: simple   Destruction method: cryotherapy   Informed consent: discussed and consent obtained   Timeout:  patient name, date of birth, surgical site, and procedure verified Lesion destroyed using liquid nitrogen: Yes   Region frozen until ice ball extended beyond lesion: Yes   Cryo cycles: 1 or 2. Outcome: patient tolerated procedure well with no complications   Post-procedure details: wound care instructions given    IRRITANT CONTACT DERMATITIS DUE TO OTHER AGENTS   ACTINIC ELASTOSIS    Return for appointment as scheduled.  LILLETTE Rosina Mayans, CMA, am acting as scribe for Boneta Sharps, MD .   Documentation: I have reviewed the above documentation for accuracy and completeness, and I agree with the above.  Boneta Sharps, MD

## 2024-10-03 ENCOUNTER — Ambulatory Visit: Admitting: Dermatology
